# Patient Record
Sex: Male | Born: 1975 | Race: White | Hispanic: No | Marital: Single | Smoking: Current every day smoker
Health system: Southern US, Community
[De-identification: ages and names within clinical notes are randomized; demographics above are authoritative.]

## PROBLEM LIST (undated history)

## (undated) DIAGNOSIS — E119 Type 2 diabetes mellitus without complications: Secondary | ICD-10-CM

## (undated) DIAGNOSIS — I10 Essential (primary) hypertension: Secondary | ICD-10-CM

## (undated) HISTORY — PX: ROTATOR CUFF REPAIR: SHX139

---

## 2010-07-21 ENCOUNTER — Emergency Department (HOSPITAL_COMMUNITY): Payer: BC Managed Care – PPO

## 2010-07-21 ENCOUNTER — Emergency Department (HOSPITAL_COMMUNITY)
Admission: EM | Admit: 2010-07-21 | Discharge: 2010-07-22 | Disposition: A | Discharge status: Home / Self Care | Payer: BC Managed Care – PPO | Attending: Emergency Medicine | Admitting: Emergency Medicine

## 2010-07-21 DIAGNOSIS — R059 Cough, unspecified: Secondary | ICD-10-CM | POA: Insufficient documentation

## 2010-07-21 DIAGNOSIS — R05 Cough: Secondary | ICD-10-CM | POA: Insufficient documentation

## 2010-07-21 DIAGNOSIS — R509 Fever, unspecified: Secondary | ICD-10-CM | POA: Insufficient documentation

## 2010-07-21 DIAGNOSIS — R0602 Shortness of breath: Secondary | ICD-10-CM | POA: Insufficient documentation

## 2010-07-21 LAB — RAPID STREP SCREEN (MED CTR MEBANE ONLY): Streptococcus, Group A Screen (Direct): NEGATIVE

## 2012-01-28 ENCOUNTER — Emergency Department (INDEPENDENT_AMBULATORY_CARE_PROVIDER_SITE_OTHER)
Admission: EM | Admit: 2012-01-28 | Discharge: 2012-01-28 | Disposition: A | Discharge status: Home / Self Care | Payer: BC Managed Care – PPO | Source: Home / Self Care

## 2012-01-28 ENCOUNTER — Encounter (HOSPITAL_COMMUNITY): Payer: Self-pay | Admitting: *Deleted

## 2012-01-28 ENCOUNTER — Emergency Department (INDEPENDENT_AMBULATORY_CARE_PROVIDER_SITE_OTHER): Payer: BC Managed Care – PPO

## 2012-01-28 DIAGNOSIS — S40021A Contusion of right upper arm, initial encounter: Secondary | ICD-10-CM

## 2012-01-28 DIAGNOSIS — S40029A Contusion of unspecified upper arm, initial encounter: Secondary | ICD-10-CM

## 2012-01-28 NOTE — ED Notes (Signed)
Pt. requests a work note.  States he was sent home from work [employer]. and today due to pain.  Works as a Production designer, theatre/television/film at [employer].  Discussed with Dr. Artis Flock and he said to do the note to return to work tomorrow.  Note done as directed and given to pt.  Pt. not happy. States that is why he came here.  Pt. Asked what would constitute getting a note to be out of work.  I said a fracture.  I suggested he call Dr. August Saucer in the AM and see if he can be seen.
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
## 2018-06-16 ENCOUNTER — Telehealth: Payer: Self-pay | Admitting: Physician Assistant

## 2018-06-16 DIAGNOSIS — Z20822 Contact with and (suspected) exposure to covid-19: Secondary | ICD-10-CM

## 2018-06-16 DIAGNOSIS — R6889 Other general symptoms and signs: Principal | ICD-10-CM

## 2018-06-16 NOTE — Progress Notes (Signed)
E-Visit for Corona Virus Screening  Based on your current symptoms, you may very well have the virus, however your symptoms are mild. Currently, not all patients are being tested. If the symptoms are mild and there is not a known exposure, performing the test is not indicated.  You have been enrolled in MyChart Home Monitoring for COVID-19. Daily you will receive a questionnaire within the MyChart website. Our COVID-19 response team will be monitoring your responses daily.  Coronavirus disease 2019 (COVID-19) is a respiratory illness that can spread from person to person. The virus that causes COVID-19 is a new virus that was first identified in the country of [place] but is now found in multiple other countries and has spread to the [place].  Symptoms associated with the virus are mild to severe fever, cough, and shortness of breath. There is currently no vaccine to protect against COVID-19, and there is no specific antiviral treatment for the virus.   To be considered HIGH RISK for Coronavirus (COVID-19), you have to meet the following criteria:  . Traveled to [place], [place], [place], [place] or [place]; or in the [place] to [place], [place], [place], or [place]; and have fever, cough, and shortness of breath within the last 2 weeks of travel OR  . Been in close contact with a person diagnosed with COVID-19 within the last 2 weeks and have fever, cough, and shortness of breath  . IF YOU DO NOT MEET THESE CRITERIA, YOU ARE CONSIDERED LOW RISK FOR COVID-19.   It is vitally important that if you feel that you have an infection such as this virus or any other virus that you stay home and away from places where you may spread it to others.  You should self-quarantine for 14 days if you have symptoms that could potentially be coronavirus and avoid contact with people age 17 and older.   You can use medication such as Delsym for cough  You may also take acetaminophen (Tylenol) as needed  for fever.   Reduce your risk of any infection by using the same precautions used for avoiding the common cold or flu:  Marland Kitchen Wash your hands often with soap and warm water for at least 20 seconds.  If soap and water are not readily available, use an alcohol-based hand sanitizer with at least 60% alcohol.  . If coughing or sneezing, cover your mouth and nose by coughing or sneezing into the elbow areas of your shirt or coat, into a tissue or into your sleeve (not your hands). . Avoid shaking hands with others and consider head nods or verbal greetings only. . Avoid touching your eyes, nose, or mouth with unwashed hands.  . Avoid close contact with people who are sick. . Avoid places or events with large numbers of people in one location, like concerts or sporting events. . Carefully consider travel plans you have or are making. . If you are planning any travel outside or inside the [place], visit the CDC's Travelers' Health webpage for the latest health notices. . If you have some symptoms but not all symptoms, continue to monitor at home and seek medical attention if your symptoms worsen. . If you are having a medical emergency, call 911.  HOME CARE . Only take medications as instructed by your medical team. . Drink plenty of fluids and get plenty of rest. . A steam or ultrasonic humidifier can help if you have congestion.   GET HELP RIGHT AWAY IF: .  You develop worsening fever. . You become short of breath . You cough up blood. . Your symptoms become more severe MAKE SURE YOU   Understand these instructions.  Will watch your condition.  Will get help right away if you are not doing well or get worse.  Your e-visit answers were reviewed by a board certified advanced clinical practitioner to complete your personal care plan.  Depending on the condition, your plan could have included both over the counter or prescription medications.  If there is a problem please reply once you have received a  response from your provider. Your safety is important to us.  If you have drug allergies check your prescription carefully.    You can use MyChart to ask questions about today's visit, request a non-urgent call back, or ask for a work or school excuse for 24 hours related to this e-Visit. If it has been greater than 24 hours you will need to follow up with your provider, or enter a new e-Visit to address those concerns. You will get an e-mail in the next two days asking about your experience.  I hope that your e-visit has been valuable and will speed your recovery. Thank you for using e-visits.    

## 2018-06-16 NOTE — Progress Notes (Signed)
I have spent 5 minutes in review of e-visit questionnaire, review and updating patient chart, medical decision making and response to patient.   Anaiza Behrens Cody Jamani Bearce, PA-C    

## 2018-06-20 ENCOUNTER — Encounter: Payer: Self-pay | Admitting: Family Medicine

## 2018-06-20 ENCOUNTER — Telehealth (INDEPENDENT_AMBULATORY_CARE_PROVIDER_SITE_OTHER): Payer: Managed Care, Other (non HMO) | Admitting: Family Medicine

## 2018-06-20 DIAGNOSIS — R6889 Other general symptoms and signs: Secondary | ICD-10-CM

## 2018-06-20 DIAGNOSIS — Z20822 Contact with and (suspected) exposure to covid-19: Secondary | ICD-10-CM | POA: Insufficient documentation

## 2018-06-20 NOTE — Progress Notes (Signed)
Frederick Powell - 44 y.o. male MRN [id number]  Date of birth: 07/26/1975   This visit type was conducted due to national recommendations for restrictions regarding the COVID-19 Pandemic (e.g. social distancing).  This format is felt to be most appropriate for this patient at this time.  All issues noted in this document were discussed and addressed.  No physical exam was performed (except for noted visual exam findings with Video Visits).  I discussed the limitations of evaluation and management by telemedicine and the availability of in person appointments. The patient expressed understanding and agreed to proceed.  I connected with@ on 06/20/18 at  2:00 PM EDT by a video enabled telemedicine application and verified that I am speaking with the correct person using two identifiers.   Patient Location: Home [address] [place], [place] [address] [address]   Provider location:   [facility]  Chief Complaint  Patient presents with  . URI    COVID Sy/Sx, tested- not resulted yet. Sore throat/fever/cough/congestion-neon green mucus/head/sinus pressure/muscle aches/fatigued    HPI  Frederick Powell is a 43 y.o. male who presents via audio/video conferencing for a telehealth visit today.  This is my initial visit with him and he has symptoms concerning for COVID-19.  He completed E-visit on 5/7 after initial onset of symptoms and had COVID testing at drive-thru site at [facility] today.  He reports continued symptoms of fever, body aches, sore throat, congestion, fatigue and cough.  He denies shortness of breath, wheezing, tightness in chest, nausea, vomiting, diarrhea.  He is taking tylenol which reduces fever to around 99.5.  He denies any other medical problems including HTN or diabetes.  He is a daily smoker.      ROS:  A comprehensive ROS was completed and negative except as noted per HPI  No past medical history on file.  No past surgical history on file.   Family History  Problem Relation Age of Onset  . Colitis Mother     Social History   Socioeconomic History  . Marital status: Single    Spouse name: Not on file  . Number of children: Not on file  . Years of education: Not on file  . Highest education level: Not on file  Occupational History  . Not on file  Social Needs  . Financial resource strain: Not on file  . Food insecurity:    Worry: Not on file    Inability: Not on file  . Transportation needs:    Medical: Not on file    Non-medical: Not on file  Tobacco Use  . Smoking status: Current Some Day Smoker    Types: Cigarettes  . Smokeless tobacco: Never Used  Substance and Sexual Activity  . Alcohol use: Yes    Alcohol/week: 2.0 standard drinks    Types: 2 Cans of beer per week  . Drug use: No  . Sexual activity: Yes    Birth control/protection: None  Lifestyle  . Physical activity:    Days per week: Not on file    Minutes per session: Not on file  . Stress: Not on file  Relationships  . Social connections:    Talks on phone: Not on file    Gets together: Not on file    Attends religious service: Not on file    Active member of club or organization: Not on file    Attends meetings of clubs or organizations: Not on file    Relationship status: Not on  file  . Intimate partner violence:    Fear of current or ex partner: Not on file    Emotionally abused: Not on file    Physically abused: Not on file    Forced sexual activity: Not on file  Other Topics Concern  . Not on file  Social History Narrative  . Not on file     Current Outpatient Medications:  .  acetaminophen (TYLENOL) 325 MG tablet, Take 650 mg by mouth every 4 (four) hours as needed., Disp: , Rfl:   EXAM:  VITALS per patient if applicable: Temp 99.5 F (37.5 C) (Oral)   Ht 6' (1.829 m)   Wt 215 lb (97.5 kg) Comment: pt reports  BMI 29.16 kg/m   GENERAL: alert, oriented, appears well and in no acute distress  HEENT: atraumatic,  conjunttiva clear, no obvious abnormalities on inspection of external nose and ears  NECK: normal movements of the head and neck  LUNGS: on inspection no signs of respiratory distress, breathing rate appears normal, no obvious gross SOB, gasping or wheezing  CV: no obvious cyanosis  MS: moves all visible extremities without noticeable abnormality  PSYCH/NEURO: pleasant and cooperative, no obvious depression or anxiety, speech and thought processing grossly intact  ASSESSMENT AND PLAN:  Discussed the following assessment and plan:  Suspected Covid-19 Virus Infection -Symptoms consistent with viral etiology, possibly COVID-19 -He will continue to self isolate at home  -Provided with instruction that he may return to work once period of at least 7 days since initial symptoms have passed AND he is fever free with improving respiratory symptoms for 72 hours.  -Recommend warm salt water gargles/chloraseptic spray for sore throat -Push fluids and continue tylenol as needed.  -Reviewed if and when he should seek further help such as emergency care.        I discussed the assessment and treatment plan with the patient. The patient was provided an opportunity to ask questions and all were answered. The patient agreed with the plan and demonstrated an understanding of the instructions.   The patient was advised to call back or seek an in-person evaluation if the symptoms worsen or if the condition fails to improve as anticipated.    Everrett Coombeody Gurkaran Rahm, DO

## 2018-06-20 NOTE — Patient Instructions (Signed)
Increase fluid intake You may use chloraseptic spray and warm salt water gargles for sore throat   It was great to see you!  You have a viral upper respiratory infection. Antibiotics are not needed for this.  Viral infections usually take 7-10 days to resolve.  The cough can last a few weeks to go away.  Given the current COVID-19 outbreak, it is my professional recommendation that for your symptoms you should self-isolate for at least 7 days from when your symptoms started. Please do not return to work until you are fever free for at least 3 days without medications and significant improvement of your symptoms.  If you develop severe shortness of breath, uncontrolled fevers, coughing up blood, confusion, chest pain, or signs of dehydration (such as significantly decreased urine amounts or dizziness with standing) please CALL the ER and then GO to the ER.  Push fluids and get plenty of rest.  Call clinic with questions.  I hope you start feeling better soon!

## 2018-06-20 NOTE — Assessment & Plan Note (Signed)
-  Symptoms consistent with viral etiology, possibly COVID-19 -He will continue to self isolate at home  -Provided with instruction that he may return to work once period of at least 7 days since initial symptoms have passed AND he is fever free with improving respiratory symptoms for 72 hours.  -Recommend warm salt water gargles/chloraseptic spray for sore throat -Push fluids and continue tylenol as needed.  -Reviewed if and when he should seek further help such as emergency care.

## 2019-02-15 ENCOUNTER — Encounter (HOSPITAL_COMMUNITY): Payer: Self-pay

## 2019-02-15 ENCOUNTER — Emergency Department (HOSPITAL_COMMUNITY)
Admission: EM | Admit: 2019-02-15 | Discharge: 2019-02-15 | Disposition: A | Discharge status: Home / Self Care | Payer: Managed Care, Other (non HMO) | Attending: Emergency Medicine | Admitting: Emergency Medicine

## 2019-02-15 ENCOUNTER — Emergency Department (HOSPITAL_COMMUNITY): Payer: Managed Care, Other (non HMO)

## 2019-02-15 ENCOUNTER — Other Ambulatory Visit: Payer: Self-pay

## 2019-02-15 DIAGNOSIS — S0990XA Unspecified injury of head, initial encounter: Secondary | ICD-10-CM | POA: Diagnosis present

## 2019-02-15 DIAGNOSIS — S0083XA Contusion of other part of head, initial encounter: Secondary | ICD-10-CM | POA: Insufficient documentation

## 2019-02-15 DIAGNOSIS — Y939 Activity, unspecified: Secondary | ICD-10-CM | POA: Diagnosis not present

## 2019-02-15 DIAGNOSIS — Y999 Unspecified external cause status: Secondary | ICD-10-CM | POA: Insufficient documentation

## 2019-02-15 DIAGNOSIS — Y92410 Unspecified street and highway as the place of occurrence of the external cause: Secondary | ICD-10-CM | POA: Diagnosis not present

## 2019-02-15 DIAGNOSIS — F17210 Nicotine dependence, cigarettes, uncomplicated: Secondary | ICD-10-CM | POA: Diagnosis not present

## 2019-02-15 LAB — URINALYSIS, COMPLETE (UACMP) WITH MICROSCOPIC
Bacteria, UA: NONE SEEN
Bilirubin Urine: NEGATIVE
Glucose, UA: >=500 mg/dL — AB
Hgb urine dipstick: NEGATIVE
Ketones, ur: 5 mg/dL — AB
Leukocytes,Ua: NEGATIVE
Nitrite: NEGATIVE
Protein, ur: NEGATIVE mg/dL
Specific Gravity, Urine: 1.027 (ref 1.005–1.030)
pH: 5 (ref 5.0–8.0)

## 2019-02-15 LAB — CBC WITH DIFFERENTIAL/PLATELET
Abs Immature Granulocytes: 0.07 10*3/uL (ref 0.00–0.07)
Basophils Absolute: 0.1 10*3/uL (ref 0.0–0.1)
Basophils Relative: 1 %
Eosinophils Absolute: 0.8 10*3/uL — ABNORMAL HIGH (ref 0.0–0.5)
Eosinophils Relative: 8 %
HCT: 42.3 % (ref 39.0–52.0)
Hemoglobin: 14.3 g/dL (ref 13.0–17.0)
Immature Granulocytes: 1 %
Lymphocytes Relative: 18 %
Lymphs Abs: 1.8 10*3/uL (ref 0.7–4.0)
MCH: 29.4 pg (ref 26.0–34.0)
MCHC: 33.8 g/dL (ref 30.0–36.0)
MCV: 86.9 fL (ref 80.0–100.0)
Monocytes Absolute: 0.6 10*3/uL (ref 0.1–1.0)
Monocytes Relative: 6 %
Neutro Abs: 6.8 10*3/uL (ref 1.7–7.7)
Neutrophils Relative %: 66 %
Platelets: 253 10*3/uL (ref 150–400)
RBC: 4.87 MIL/uL (ref 4.22–5.81)
RDW: 12.2 % (ref 11.5–15.5)
WBC: 10.2 10*3/uL (ref 4.0–10.5)
nRBC: 0 % (ref 0.0–0.2)

## 2019-02-15 LAB — COMPREHENSIVE METABOLIC PANEL
ALT: 18 U/L (ref 0–44)
AST: 17 U/L (ref 15–41)
Albumin: 3.6 g/dL (ref 3.5–5.0)
Alkaline Phosphatase: 50 U/L (ref 38–126)
Anion gap: 10 (ref 5–15)
BUN: 14 mg/dL (ref 6–20)
CO2: 22 mmol/L (ref 22–32)
Calcium: 8.7 mg/dL — ABNORMAL LOW (ref 8.9–10.3)
Chloride: 100 mmol/L (ref 98–111)
Creatinine, Ser: 0.99 mg/dL (ref 0.61–1.24)
GFR calc Af Amer: >60 mL/min (ref >60)
GFR calc non Af Amer: >60 mL/min (ref >60)
Glucose, Bld: 401 mg/dL — ABNORMAL HIGH (ref 70–99)
Potassium: 4 mmol/L (ref 3.5–5.1)
Sodium: 132 mmol/L — ABNORMAL LOW (ref 135–145)
Total Bilirubin: 0.8 mg/dL (ref 0.3–1.2)
Total Protein: 6.6 g/dL (ref 6.5–8.1)

## 2019-02-15 LAB — RAPID URINE DRUG SCREEN, HOSP PERFORMED
Amphetamines: NOT DETECTED
Barbiturates: NOT DETECTED
Benzodiazepines: POSITIVE — AB
Cocaine: NOT DETECTED
Opiates: NOT DETECTED
Tetrahydrocannabinol: POSITIVE — AB

## 2019-02-15 LAB — CBG MONITORING, ED: Glucose-Capillary: 367 mg/dL — ABNORMAL HIGH (ref 70–99)

## 2019-02-15 LAB — ETHANOL: Alcohol, Ethyl (B): <10 mg/dL (ref <10)

## 2019-02-15 MED ORDER — METFORMIN HCL 500 MG PO TABS: 500.0000 mg | ORAL_TABLET | Freq: Two times a day (BID) | ORAL | 0 refills | Status: DC
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
## 2019-02-15 NOTE — Discharge Instructions (Addendum)
Your blood sugar was noted to be higher than normal.  To a point that we suspect you may have diabetes.  We will start you on a medication called Metformin.  This medication should be taken twice daily with food to avoid upset stomach.   Head Injury You have been seen today for a head injury.   Close observation: The close observation period is usually 6 hours from the injury. This includes staying awake and having a trustworthy adult monitor you to assure your condition does not worsen. You should be in regular contact with this person and ideally, they should be able to monitor you in person.  Secondary observation: The secondary observation period is usually 24 hours from the injury. You are allowed to sleep during this time. A trustworthy adult should intermittently monitor you to assure your condition does not worsen.   Overall head injury/concussion care: Rest: Be sure to get plenty of rest. You will need more rest and sleep while you recover. Hydration: Be sure to stay well hydrated by having a goal of drinking about 0.5 liters of water an hour. Pain:  Antiinflammatory medications: Take 600 mg of ibuprofen every 6 hours or 440 mg (over the counter dose) to 500 mg (prescription dose) of naproxen every 12 hours or for the next 3 days. After this time, these medications may be used as needed for pain. Take these medications with food to avoid upset stomach. Choose only one of these medications, do not take them together. Tylenol: Should you continue to have additional pain while taking the ibuprofen or naproxen, you may add in tylenol as needed. Your daily total maximum amount of tylenol from all sources should be limited to 4000mg /day for persons without liver problems, or 2000mg /day for those with liver problems. Return to sports and activities: In general, you may return to normal activities once symptoms have subsided, however, you would ideally be cleared by a primary care provider or other  qualified medical professional prior to return to these activities.  Follow up: Follow up with the concussion clinic or your primary care provider for further management of this issue. Return: Return to the ED should you begin to have confusion, abnormal behavior, aggression, violence, or personality changes, repeated vomiting, vision loss, numbness or weakness on one side of the body, difficulty standing due to dizziness, significantly worsening pain, or any other major concerns.

## 2019-02-21 ENCOUNTER — Encounter: Payer: Self-pay | Admitting: Family Medicine

## 2019-02-21 ENCOUNTER — Other Ambulatory Visit: Payer: Self-pay

## 2019-02-21 ENCOUNTER — Ambulatory Visit (INDEPENDENT_AMBULATORY_CARE_PROVIDER_SITE_OTHER): Payer: Managed Care, Other (non HMO) | Admitting: Family Medicine

## 2019-02-21 VITALS — BP 140/98 | HR 75 | Temp 97.6°F | Ht 72.0 in | Wt 194.4 lb

## 2019-02-21 DIAGNOSIS — F419 Anxiety disorder, unspecified: Secondary | ICD-10-CM

## 2019-02-21 DIAGNOSIS — R739 Hyperglycemia, unspecified: Secondary | ICD-10-CM

## 2019-02-21 DIAGNOSIS — R7309 Other abnormal glucose: Secondary | ICD-10-CM | POA: Diagnosis not present

## 2019-02-21 DIAGNOSIS — Z13220 Encounter for screening for lipoid disorders: Secondary | ICD-10-CM | POA: Diagnosis not present

## 2019-02-21 LAB — BASIC METABOLIC PANEL
BUN: 12 mg/dL (ref 6–23)
CO2: 27 mEq/L (ref 19–32)
Calcium: 9.3 mg/dL (ref 8.4–10.5)
Chloride: 99 mEq/L (ref 96–112)
Creatinine, Ser: 0.9 mg/dL (ref 0.40–1.50)
GFR: 91.39 mL/min (ref >60.00)
Glucose, Bld: 327 mg/dL — ABNORMAL HIGH (ref 70–99)
Potassium: 4.1 mEq/L (ref 3.5–5.1)
Sodium: 135 mEq/L (ref 135–145)

## 2019-02-21 LAB — LIPID PANEL
Cholesterol: 161 mg/dL (ref 0–200)
HDL: 47.8 mg/dL (ref >39.00)
LDL Cholesterol: 89 mg/dL (ref 0–99)
NonHDL: 113.6
Total CHOL/HDL Ratio: 3
Triglycerides: 121 mg/dL (ref 0.0–149.0)
VLDL: 24.2 mg/dL (ref 0.0–40.0)

## 2019-02-21 LAB — MICROALBUMIN / CREATININE URINE RATIO
Creatinine,U: 100.5 mg/dL
Microalb Creat Ratio: 2.2 mg/g (ref 0.0–30.0)
Microalb, Ur: 2.2 mg/dL — ABNORMAL HIGH (ref 0.0–1.9)

## 2019-02-21 LAB — HEMOGLOBIN A1C: Hgb A1c MFr Bld: 13.3 % — ABNORMAL HIGH (ref 4.6–6.5)

## 2019-02-21 MED ORDER — ESCITALOPRAM OXALATE 10 MG PO TABS: 10.0000 mg | ORAL_TABLET | Freq: Every day | ORAL | 1 refills | Status: DC
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
## 2019-02-21 NOTE — Assessment & Plan Note (Signed)
Admits to increase anxiety Discussed legal and medical consequences of taking unprescribed medications. He expresses understanding.  Will start lexapro to help with anxiety management.  F/u in about 4-6 weeks.

## 2019-02-21 NOTE — Assessment & Plan Note (Addendum)
Blood sugars in ED consistent with diabetes. He is asymptomatic at this time.  Check A1c as well as lipid panel for risk stratification.   Will have him f/u once labs results return to discuss tx.

## 2019-02-21 NOTE — Patient Instructions (Signed)
We'll be in touch with lab results Follow up with me in 1 month for new medication.    Generalized Anxiety Disorder, Adult Generalized anxiety disorder (GAD) is a mental health disorder. People with this condition constantly worry about everyday events. Unlike normal anxiety, worry related to GAD is not triggered by a specific event. These worries also do not fade or get better with time. GAD interferes with life functions, including relationships, work, and school. GAD can vary from mild to severe. People with severe GAD can have intense waves of anxiety with physical symptoms (panic attacks). What are the causes? The exact cause of GAD is not known. What increases the risk? This condition is more likely to develop in:  Women.  People who have a family history of anxiety disorders.  People who are very shy.  People who experience very stressful life events, such as the death of a loved one.  People who have a very stressful family environment. What are the signs or symptoms? People with GAD often worry excessively about many things in their lives, such as their health and family. They may also be overly concerned about:  Doing well at work.  Being on time.  Natural disasters.  Friendships. Physical symptoms of GAD include:  Fatigue.  Muscle tension or having muscle twitches.  Trembling or feeling shaky.  Being easily startled.  Feeling like your heart is pounding or racing.  Feeling out of breath or like you cannot take a deep breath.  Having trouble falling asleep or staying asleep.  Sweating.  Nausea, diarrhea, or irritable bowel syndrome (IBS).  Headaches.  Trouble concentrating or remembering facts.  Restlessness.  Irritability. How is this diagnosed? Your health care provider can diagnose GAD based on your symptoms and medical history. You will also have a physical exam. The health care provider will ask specific questions about your symptoms,  including how severe they are, when they started, and if they come and go. Your health care provider may ask you about your use of alcohol or drugs, including prescription medicines. Your health care provider may refer you to a mental health specialist for further evaluation. Your health care provider will do a thorough examination and may perform additional tests to rule out other possible causes of your symptoms. To be diagnosed with GAD, a person must have anxiety that:  Is out of his or her control.  Affects several different aspects of his or her life, such as work and relationships.  Causes distress that makes him or her unable to take part in normal activities.  Includes at least three physical symptoms of GAD, such as restlessness, fatigue, trouble concentrating, irritability, muscle tension, or sleep problems. Before your health care provider can confirm a diagnosis of GAD, these symptoms must be present more days than they are not, and they must last for six months or longer. How is this treated? The following therapies are usually used to treat GAD:  Medicine. Antidepressant medicine is usually prescribed for long-term daily control. Antianxiety medicines may be added in severe cases, especially when panic attacks occur.  Talk therapy (psychotherapy). Certain types of talk therapy can be helpful in treating GAD by providing support, education, and guidance. Options include: ? Cognitive behavioral therapy (CBT). People learn coping skills and techniques to ease their anxiety. They learn to identify unrealistic or negative thoughts and behaviors and to replace them with positive ones. ? Acceptance and commitment therapy (ACT). This treatment teaches people how to be mindful as  a way to cope with unwanted thoughts and feelings. ? Biofeedback. This process trains you to manage your body's response (physiological response) through breathing techniques and relaxation methods. You will work  with a therapist while machines are used to monitor your physical symptoms.  Stress management techniques. These include yoga, meditation, and exercise. A mental health specialist can help determine which treatment is best for you. Some people see improvement with one type of therapy. However, other people require a combination of therapies. Follow these instructions at home:  Take over-the-counter and prescription medicines only as told by your health care provider.  Try to maintain a normal routine.  Try to anticipate stressful situations and allow extra time to manage them.  Practice any stress management or self-calming techniques as taught by your health care provider.  Do not punish yourself for setbacks or for not making progress.  Try to recognize your accomplishments, even if they are small.  Keep all follow-up visits as told by your health care provider. This is important. Contact a health care provider if:  Your symptoms do not get better.  Your symptoms get worse.  You have signs of depression, such as: ? A persistently sad, cranky, or irritable mood. ? Loss of enjoyment in activities that used to bring you joy. ? Change in weight or eating. ? Changes in sleeping habits. ? Avoiding friends or family members. ? Loss of energy for normal tasks. ? Feelings of guilt or worthlessness. Get help right away if:  You have serious thoughts about hurting yourself or others. If you ever feel like you may hurt yourself or others, or have thoughts about taking your own life, get help right away. You can go to your nearest emergency department or call:  Your local emergency services (911 in the [place]  A suicide crisis helpline, such as the [place] at 646 105 1478. This is open 24 hours a day. Summary  Generalized anxiety disorder (GAD) is a mental health disorder that involves worry that is not triggered by a specific event.  People with GAD  often worry excessively about many things in their lives, such as their health and family.  GAD may cause physical symptoms such as restlessness, trouble concentrating, sleep problems, frequent sweating, nausea, diarrhea, headaches, and trembling or muscle twitching.  A mental health specialist can help determine which treatment is best for you. Some people see improvement with one type of therapy. However, other people require a combination of therapies. This information is not intended to replace advice given to you by your health care provider. Make sure you discuss any questions you have with your health care provider. Document Revised: 01/08/2017 Document Reviewed: 12/17/2015 Elsevier Patient Education  2020 [employer].

## 2019-02-21 NOTE — Assessment & Plan Note (Signed)
Healing cuts and bruising to legs He is not currently driving, may need DMV paperwork completed

## 2019-02-23 NOTE — Progress Notes (Signed)
Labs confirm he has diabetes.  Please have him schedule visit with me to discuss treatment.  VV is ok.   Thanks!

## 2019-02-24 ENCOUNTER — Encounter: Payer: Self-pay | Admitting: Family Medicine

## 2019-02-24 ENCOUNTER — Telehealth (INDEPENDENT_AMBULATORY_CARE_PROVIDER_SITE_OTHER): Payer: Managed Care, Other (non HMO) | Admitting: Family Medicine

## 2019-02-24 VITALS — Ht 72.0 in | Wt 194.0 lb

## 2019-02-24 DIAGNOSIS — E1165 Type 2 diabetes mellitus with hyperglycemia: Secondary | ICD-10-CM

## 2019-02-24 MED ORDER — ONETOUCH VERIO W/DEVICE KIT: PACK | 0 refills | Status: AC

## 2019-02-24 MED ORDER — ONETOUCH VERIO VI STRP: ORAL_STRIP | 12 refills | Status: DC

## 2019-02-24 MED ORDER — ONETOUCH DELICA LANCETS 30G MISC: 1.0000 | Freq: Every day | 1 refills | Status: DC

## 2019-02-24 MED ORDER — NOVOFINE 30G X 8 MM MISC: 1.0000 | 0 refills | Status: DC | PRN

## 2019-02-24 MED ORDER — LANTUS SOLOSTAR 100 UNIT/ML ~~LOC~~ SOPN: 15.0000 [IU] | PEN_INJECTOR | Freq: Every day | SUBCUTANEOUS | 1 refills | Status: DC

## 2019-02-24 NOTE — Progress Notes (Signed)
Frederick Powell - 44 y.o. male MRN [id number]  Date of birth: Jun 25, 1975   This visit type was conducted due to national recommendations for restrictions regarding the COVID-19 Pandemic (e.g. social distancing).  This format is felt to be most appropriate for this patient at this time.  All issues noted in this document were discussed and addressed.  No physical exam was performed (except for noted visual exam findings with Video Visits).  I discussed the limitations of evaluation and management by telemedicine and the availability of in person appointments. The patient expressed understanding and agreed to proceed.  I connected with@ on 02/24/19 at  9:20 AM EST by a video enabled telemedicine application and verified that I am speaking with the correct person using two identifiers.  Present at visit: Luetta Nutting, DO [facility]   Patient Location: Home [place] [address]   Provider location:   [place]  Chief Complaint  Patient presents with  . Follow-up    To discuss blood work     HPI  Frederick Powell is a 44 y.o. male who presents via audio/video conferencing for a telehealth visit today.  He is following up on recent labs confirming diabetes.  Current a1c of 13.3%.  He reports that he has been a little more thirsty and fatigued.  He denies increased urination.  He admits he could make changes to his diet.  He is treatment naive for his diabetes and knowledge of diabetes is limited.      ROS:  A comprehensive ROS was completed and negative except as noted per HPI  No past medical history on file.  Past Surgical History:  Procedure Laterality Date  . ROTATOR CUFF REPAIR      Family History  Problem Relation Age of Onset  . Colitis Mother     Social History   Socioeconomic History  . Marital status: Single    Spouse name: Not on file  . Number of children: Not on file  . Years of education: Not on file  .  Highest education level: Not on file  Occupational History  . Not on file  Tobacco Use  . Smoking status: Current Some Day Smoker    Types: Cigarettes  . Smokeless tobacco: Never Used  Substance and Sexual Activity  . Alcohol use: Yes    Alcohol/week: 2.0 standard drinks    Types: 2 Cans of beer per week  . Drug use: No  . Sexual activity: Yes    Birth control/protection: None  Other Topics Concern  . Not on file  Social History Narrative  . Not on file   Social Determinants of Health   Financial Resource Strain:   . Difficulty of Paying Living Expenses: Not on file  Food Insecurity:   . Worried About Charity fundraiser in the Last Year: Not on file  . Ran Out of Food in the Last Year: Not on file  Transportation Needs:   . Lack of Transportation (Medical): Not on file  . Lack of Transportation (Non-Medical): Not on file  Physical Activity:   . Days of Exercise per Week: Not on file  . Minutes of Exercise per Session: Not on file  Stress:   . Feeling of Stress : Not on file  Social Connections:   . Frequency of Communication with Friends and Family: Not on file  . Frequency of Social Gatherings with Friends and Family: Not on file  . Attends Religious Services: Not  on file  . Active Member of Clubs or Organizations: Not on file  . Attends Archivist Meetings: Not on file  . Marital Status: Not on file  Intimate Partner Violence:   . Fear of Current or Ex-Partner: Not on file  . Emotionally Abused: Not on file  . Physically Abused: Not on file  . Sexually Abused: Not on file     Current Outpatient Medications:  .  acetaminophen (TYLENOL) 325 MG tablet, Take 650 mg by mouth every 4 (four) hours as needed., Disp: , Rfl:  .  escitalopram (LEXAPRO) 10 MG tablet, Take 1 tablet (10 mg total) by mouth daily. 1/2 tab for first week then increase to whole tab, Disp: 30 tablet, Rfl: 1 .  metFORMIN (GLUCOPHAGE) 500 MG tablet, Take 1 tablet (500 mg total) by mouth 2  (two) times daily., Disp: 60 tablet, Rfl: 0 .  Blood Glucose Monitoring Suppl (ONETOUCH VERIO) w/Device KIT, Use to check glucose daily.  Please dispense appropriate lancets and strips., Disp: 1 kit, Rfl: 0 .  glucose blood (ONETOUCH VERIO) test strip, Use as instructed to check blood glucose, Disp: 100 each, Rfl: 12 .  Insulin Glargine (LANTUS SOLOSTAR) 100 UNIT/ML Solostar Pen, Inject 15 Units into the skin daily., Disp: 15 mL, Rfl: 1 .  Insulin Pen Needle (NOVOFINE) 30G X 8 MM MISC, Inject 10 each into the skin as needed (to inject insulin)., Disp: 100 each, Rfl: 0 .  OneTouch Delica Lancets 97C MISC, 1 Device by Does not apply route daily. Use as instructed to check blood glucose daily, Disp: 100 each, Rfl: 1  EXAM:  VITALS per patient if applicable: Ht 6' (1.638 m)   Wt 194 lb (88 kg)   BMI 26.31 kg/m   GENERAL: alert, oriented, appears well and in no acute distress  HEENT: atraumatic, conjunttiva clear, no obvious abnormalities on inspection of external nose and ears  NECK: normal movements of the head and neck  LUNGS: on inspection no signs of respiratory distress, breathing rate appears normal, no obvious gross SOB, gasping or wheezing  CV: no obvious cyanosis  MS: moves all visible extremities without noticeable abnormality  PSYCH/NEURO: pleasant and cooperative, no obvious depression or anxiety, speech and thought processing grossly intact  ASSESSMENT AND PLAN:  Discussed the following assessment and plan:  Type 2 diabetes mellitus with hyperglycemia, without long-term current use of insulin (HCC) Uncontrolled at this time.  Based on a1c and symptoms of increased thirst and fatigue I have recommended that he start insulin.  Will initiate lantus 15 units daily.  Recommend f/u in 3-4 weeks and we can titrated based on readings.   He was educated on a1c and how this correlates to his average blood sugars.  Discussed dietary changes and recommendations for carb intake and  daily exercise.  Reviewed standards of care for diabetes including recommended labs, yearly eye and foot exam.  He should received pneumovax at next in person appt as well as annual flu vaccine.   Instructed to check glucose daily Referral entered for diabetes education for continued teaching.   35 minutes of total time spent with patient including time for pre visit preparation, review and interpretation of lab results with patient, treatment recommendation and counseling on disease processes as well as coordination of care as outlined above.    I discussed the assessment and treatment plan with the patient. The patient was provided an opportunity to ask questions and all were answered. The patient agreed with the plan  and demonstrated an understanding of the instructions.   The patient was advised to call back or seek an in-person evaluation if the symptoms worsen or if the condition fails to improve as anticipated.    Luetta Nutting, DO

## 2019-02-24 NOTE — Telephone Encounter (Signed)
Letter sent via my chart

## 2019-02-24 NOTE — Assessment & Plan Note (Signed)
Uncontrolled at this time.  Based on a1c and symptoms of increased thirst and fatigue I have recommended that he start insulin.  Will initiate lantus 15 units daily.  Recommend f/u in 3-4 weeks and we can titrated based on readings.   He was educated on a1c and how this correlates to his average blood sugars.  Discussed dietary changes and recommendations for carb intake and daily exercise.  Reviewed standards of care for diabetes including recommended labs, yearly eye and foot exam.  He should received pneumovax at next in person appt as well as annual flu vaccine.   Instructed to check glucose daily Referral entered for diabetes education for continued teaching.

## 2019-02-27 ENCOUNTER — Telehealth: Payer: Self-pay | Admitting: Family Medicine

## 2019-02-27 NOTE — Telephone Encounter (Signed)
Patient mother is calling and requesting to speak to someone about getting diabetic supplies for patient . CB [phone].

## 2019-02-27 NOTE — Telephone Encounter (Signed)
LDM for pt to call office back.  I do not see a DPR in pt's chart.

## 2019-02-27 NOTE — Telephone Encounter (Signed)
Patient mother called back and said to disregard message that was left. Supplies were at pharmacy. CB [phone].

## 2019-03-10 ENCOUNTER — Other Ambulatory Visit: Payer: Self-pay

## 2019-03-10 ENCOUNTER — Ambulatory Visit (INDEPENDENT_AMBULATORY_CARE_PROVIDER_SITE_OTHER): Payer: Managed Care, Other (non HMO) | Admitting: Family Medicine

## 2019-03-10 ENCOUNTER — Encounter: Payer: Self-pay | Admitting: Family Medicine

## 2019-03-10 DIAGNOSIS — F17210 Nicotine dependence, cigarettes, uncomplicated: Secondary | ICD-10-CM | POA: Diagnosis not present

## 2019-03-10 DIAGNOSIS — R030 Elevated blood-pressure reading, without diagnosis of hypertension: Secondary | ICD-10-CM

## 2019-03-10 DIAGNOSIS — E1165 Type 2 diabetes mellitus with hyperglycemia: Secondary | ICD-10-CM

## 2019-03-10 DIAGNOSIS — F419 Anxiety disorder, unspecified: Secondary | ICD-10-CM | POA: Diagnosis not present

## 2019-03-10 MED ORDER — ESCITALOPRAM OXALATE 20 MG PO TABS: 20.0000 mg | ORAL_TABLET | Freq: Every day | ORAL | 3 refills | Status: DC

## 2019-03-10 MED ORDER — BUSPIRONE HCL 7.5 MG PO TABS: 7.5000 mg | ORAL_TABLET | Freq: Three times a day (TID) | ORAL | 1 refills | Status: DC

## 2019-03-10 NOTE — Patient Instructions (Signed)
Start buspar for anxiety Increase lexapro (escitalopram) to 20mg  each day.  Be sure to take daily for this to work.  Continue current insulin dose.  See me again in about 8 weeks.  Call to make appt at new location:  [facility] [address] [place],  [place]  [place] [phone]

## 2019-03-10 NOTE — Progress Notes (Signed)
Frederick Powell - 44 y.o. male MRN [id number]  Date of birth: Aug 02, 1975  Subjective Chief Complaint  Patient presents with  . Follow-up    follow up on DM and anxiety--anxiety is getting worse--not able to sleep--cant stop thinking--got a new job. DM. report bloos sugar reading at home 1/25- 298 & 149, 1/26--296 & 129, 1/27--154 & 135, 1/28--126 & 129, 1/29--145.     HPI Frederick Powell is a 44 y.o. male with history of anxiety and recently diagnosed T2DM here today for follow up.   T2DM:  Recent dx of T2DM with hyperglycemia.  Previous a1c of 13.3%.  Started on metformin and lantus 15 units at last appt   He is doing well with this and working on dietary changes.  Blood sugars over the past couple of days are around 110-125.  He denies hypoglycemia.  He has been referred to diabetic educator and made appt with optometrist.   Anxiety:  Reports continue anxiety with severe episodes and difficulty sleeping.  Started on lexapro at previous visit.  Reports compliance with treatment, hasn't noticed much improved yet since starting.    Depression screen [facility] 2/9 03/10/2019 02/21/2019  Decreased Interest 1 1  Down, Depressed, Hopeless 1 0  PHQ - 2 Score 2 1  Altered sleeping 3 -  Tired, decreased energy 1 -  Change in appetite 1 -  Feeling bad or failure about yourself  1 -  Trouble concentrating 3 -  Moving slowly or fidgety/restless 3 -  Suicidal thoughts 0 -  PHQ-9 Score 14 -   GAD 7 : Generalized Anxiety Score 03/10/2019  Nervous, Anxious, on Edge 3  Control/stop worrying 3  Worry too much - different things 3  Trouble relaxing 3  Restless 3  Easily annoyed or irritable 3  Afraid - awful might happen 3  Total GAD 7 Score 21    ROS:  A comprehensive ROS was completed and negative except as noted per HPI   No Known Allergies  No past medical history on file.  Past Surgical History:  Procedure Laterality Date  . ROTATOR CUFF REPAIR      Social History   Socioeconomic  History  . Marital status: Single    Spouse name: Not on file  . Number of children: Not on file  . Years of education: Not on file  . Highest education level: Not on file  Occupational History  . Not on file  Tobacco Use  . Smoking status: Current Some Day Smoker    Types: Cigarettes  . Smokeless tobacco: Never Used  Substance and Sexual Activity  . Alcohol use: Yes    Alcohol/week: 2.0 standard drinks    Types: 2 Cans of beer per week  . Drug use: No  . Sexual activity: Yes    Birth control/protection: None  Other Topics Concern  . Not on file  Social History Narrative  . Not on file   Social Determinants of Health   Financial Resource Strain:   . Difficulty of Paying Living Expenses: Not on file  Food Insecurity:   . Worried About Charity fundraiser in the Last Year: Not on file  . Ran Out of Food in the Last Year: Not on file  Transportation Needs:   . Lack of Transportation (Medical): Not on file  . Lack of Transportation (Non-Medical): Not on file  Physical Activity:   . Days of Exercise per Week: Not on file  . Minutes of Exercise per Session: Not on  file  Stress:   . Feeling of Stress : Not on file  Social Connections:   . Frequency of Communication with Friends and Family: Not on file  . Frequency of Social Gatherings with Friends and Family: Not on file  . Attends Religious Services: Not on file  . Active Member of Clubs or Organizations: Not on file  . Attends Banker Meetings: Not on file  . Marital Status: Not on file    Family History  Problem Relation Age of Onset  . Colitis Mother     Health Maintenance  Topic Date Due  . PNEUMOCOCCAL POLYSACCHARIDE VACCINE AGE 54-64 HIGH RISK  06/18/1976  . FOOT EXAM  06/18/1984  . OPHTHALMOLOGY EXAM  06/18/1984  . HIV Screening  06/18/1989  . TETANUS/TDAP  06/18/1993  . HEMOGLOBIN A1C  08/21/2019  . URINE MICROALBUMIN  02/21/2020  . INFLUENZA VACCINE  Completed     ----------------------------------------------------------------------------------------------------------------------------------------------------------------------------------------------------------------- Physical Exam BP (!) 158/100   Pulse 75   Temp 98.1 F (36.7 C) (Tympanic)   Ht 6' (1.829 m)   Wt 202 lb 9.6 oz (91.9 kg)   SpO2 97%   BMI 27.48 kg/m   Physical Exam Constitutional:      Appearance: Normal appearance.  HENT:     Head: Normocephalic and atraumatic.  Eyes:     General: No scleral icterus. Cardiovascular:     Rate and Rhythm: Normal rate and regular rhythm.  Pulmonary:     Effort: Pulmonary effort is normal.     Breath sounds: Normal breath sounds.  Skin:    General: Skin is warm and dry.  Neurological:     Mental Status: He is alert.    Diabetic Foot Exam - Simple   Simple Foot Form Diabetic Foot exam was performed with the following findings: Yes 03/12/2019  9:19 PM  Visual Inspection No deformities, no ulcerations, no other skin breakdown bilaterally: Yes Sensation Testing Intact to touch and monofilament testing bilaterally: Yes Pulse Check Posterior Tibialis and Dorsalis pulse intact bilaterally: Yes Comments      ------------------------------------------------------------------------------------------------------------------------------------------------------------------------------------------------------------------- Assessment and Plan  Type 2 diabetes mellitus with hyperglycemia, without long-term current use of insulin (HCC) Blood sugars stabilizing since starting treatment for diabetes.  Continue to work on lifestyle change.  Referred to diabetes education.  Continue current medications Has appt with optometrist.  Will think about pneumovax F/u in 3 months.   Anxiety Anxiety is not well controlled.  Increase lexapro to 20mg  and add buspar 7.5mg  TID F/u in 6-8 weeks.   Nicotine dependence Counseled on quitting however  not interested in quitting right now while anxiety is high.   Elevated blood pressure reading Likely related to anxiety and smoking.  Advised to quit smoking.   Discussed low salt diet.  If BP elevated at next f/u will add lisinopril.      This visit occurred during the SARS-CoV-2 public health emergency.  Safety protocols were in place, including screening questions prior to the visit, additional usage of staff PPE, and extensive cleaning of exam room while observing appropriate contact time as indicated for disinfecting solutions.

## 2019-03-12 ENCOUNTER — Encounter: Payer: Self-pay | Admitting: Family Medicine

## 2019-03-12 DIAGNOSIS — F17200 Nicotine dependence, unspecified, uncomplicated: Secondary | ICD-10-CM | POA: Insufficient documentation

## 2019-03-12 DIAGNOSIS — R030 Elevated blood-pressure reading, without diagnosis of hypertension: Secondary | ICD-10-CM | POA: Insufficient documentation

## 2019-03-12 NOTE — Assessment & Plan Note (Signed)
Counseled on quitting however not interested in quitting right now while anxiety is high.

## 2019-03-12 NOTE — Assessment & Plan Note (Signed)
Blood sugars stabilizing since starting treatment for diabetes.  Continue to work on lifestyle change.  Referred to diabetes education.  Continue current medications Has appt with optometrist.  Will think about pneumovax F/u in 3 months.

## 2019-03-12 NOTE — Assessment & Plan Note (Signed)
Likely related to anxiety and smoking.  Advised to quit smoking.   Discussed low salt diet.  If BP elevated at next f/u will add lisinopril.

## 2019-03-12 NOTE — Assessment & Plan Note (Signed)
Anxiety is not well controlled.  Increase lexapro to 20mg  and add buspar 7.5mg  TID F/u in 6-8 weeks.

## 2019-03-14 ENCOUNTER — Other Ambulatory Visit: Payer: Self-pay | Admitting: Family Medicine

## 2019-03-14 ENCOUNTER — Encounter: Payer: Self-pay | Admitting: Family Medicine

## 2019-03-14 MED ORDER — METFORMIN HCL 500 MG PO TABS: 500.0000 mg | ORAL_TABLET | Freq: Two times a day (BID) | ORAL | 2 refills | Status: DC

## 2019-04-05 ENCOUNTER — Encounter: Payer: Self-pay | Admitting: Family Medicine

## 2019-04-06 NOTE — Telephone Encounter (Signed)
These have been printed and placed in your box.

## 2019-04-07 ENCOUNTER — Encounter: Payer: Managed Care, Other (non HMO) | Attending: Family Medicine | Admitting: Registered"

## 2019-04-07 ENCOUNTER — Other Ambulatory Visit: Payer: Self-pay

## 2019-04-07 ENCOUNTER — Encounter: Payer: Self-pay | Admitting: Registered"

## 2019-04-07 ENCOUNTER — Encounter: Payer: Self-pay | Admitting: Family Medicine

## 2019-04-07 DIAGNOSIS — E1165 Type 2 diabetes mellitus with hyperglycemia: Secondary | ICD-10-CM | POA: Insufficient documentation

## 2019-04-07 NOTE — Patient Instructions (Addendum)
Plan:  Aim for 4-5 Carb Choices per meal (45-60 grams)  Aim for 0-2 Carbs choices per snack if hungry  Include protein with your meals and snacks Consider reading food labels for Total Carbohydrate of foods Continue your activity level. You may get more benefit by adding one more day (4 days instead of 3 days) Continue checking blood sugar, fasting 80-130; and 2 hours after with a meal target of less than 180.  Continuer taking medication as directed by MD Consider some sleep tips to help falling asleep.

## 2019-04-07 NOTE — Progress Notes (Signed)
Diabetes Self-Management Education  Visit Type: First/Initial  Appt. Start Time: 1000 Appt. End Time: 1100  04/07/2019  Mr. Frederick Powell Reason, identified by name and date of birth, is a 44 y.o. male with a diagnosis of Diabetes: Type 2.   ASSESSMENT  There were no vitals taken for this visit. There is no height or weight on file to calculate BMI.  Pt states prior to Dx he did not not he had T2DM in his family. Pt reports his mother has been in the medical field and has helped him understand the importance of managing this condition.  Patient states his goal is to get off insulin and just take oral medication. Pt states changes since diagnosis include cutting out [facility] (was drinking up to 120 oz/day), exercising more consistently, and reading labels to stay within 45-60 grams carbs per meal as his MD advised.  In addition to education regarding lowing blood sugar, patient states he is open to discuss ideas at follow-up visit to get better nutrition through meal prep rather than frozen meals.   Sleep: 6-8 hrs; 10 am-4 pm sometimes has a hard time going to sleep, but once asleep is fine. Stress: 5/10. Manageable   Diabetes Self-Management Education - 04/07/19 1005      Visit Information   Visit Type  First/Initial      Initial Visit   Diabetes Type  Type 2    Are you currently following a meal plan?  Yes    What type of meal plan do you follow?  45-60 g cho per meal    Are you taking your medications as prescribed?  Yes   metformin, insulin 15 u   Date Diagnosed  Jan 2021      Health Coping   How would you rate your overall health?  Good      Psychosocial Assessment   Patient Belief/Attitude about Diabetes  Motivated to manage diabetes    How often do you need to have someone help you when you read instructions, pamphlets, or other written materials from your doctor or pharmacy?  1 - Never    What is the last grade level you completed in school?  college      Complications   Last HgB A1C per patient/outside source  13 %    How often do you check your blood sugar?  1-2 times/day    Fasting Blood glucose range (mg/dL)  09-735    Number of hypoglycemic episodes per month  0    Have you had a dilated eye exam in the past 12 months?  No    Have you had a dental exam in the past 12 months?  No    Are you checking your feet?  No      Dietary Intake   Breakfast  oatmeal, 8-12 oz juice    Snack (morning)  none    Lunch  (frozen dinners) broccoli, chicken pasta    Snack (afternoon)  pretzels    Dinner  frozen dinner    Snack (evening)  none    Beverage(s)  4, 12 oz water with drop-in, coffee occassionally      Exercise   Exercise Type  Light (walking / raking leaves)    How many days per week to you exercise?  3    How many minutes per day do you exercise?  90    Total minutes per week of exercise  270      Patient Education   Disease  state   Definition of diabetes, type 1 and 2, and the diagnosis of diabetes    Nutrition management   Role of diet in the treatment of diabetes and the relationship between the three main macronutrients and blood glucose level;Food label reading, portion sizes and measuring food.    Physical activity and exercise   Role of exercise on diabetes management, blood pressure control and cardiac health.    Medications  Reviewed patients medication for diabetes, action, purpose, timing of dose and side effects.    Monitoring  Identified appropriate SMBG and/or A1C goals.    Chronic complications  Retinopathy and reason for yearly dilated eye exams    Psychosocial adjustment  Role of stress on diabetes      Individualized Goals (developed by patient)   Nutrition  General guidelines for healthy choices and portions discussed    Physical Activity  Exercise 3-5 times per week    Monitoring   test my blood glucose as discussed      Outcomes   Expected Outcomes  Demonstrated interest in learning. Expect positive outcomes    Future DMSE  4-6  wks    Program Status  Completed       Individualized Plan for Diabetes Self-Management Training:   Learning Objective:  Patient will have a greater understanding of diabetes self-management. Patient education plan is to attend individual and/or group sessions per assessed needs and concerns.  Patient Instructions  Plan:  Aim for 4-5 Carb Choices per meal (45-60 grams)  Aim for 0-2 Carbs choices per snack if hungry  Include protein with your meals and snacks Consider reading food labels for Total Carbohydrate of foods Continue your activity level. You may get more benefit by adding one more day (4 days instead of 3 days) Continue checking blood sugar, fasting 80-130; and 2 hours after with a meal target of less than 180.  Continuer taking medication as directed by MD Consider some sleep tips to help falling asleep.  Expected Outcomes:  Demonstrated interest in learning. Expect positive outcomes  Education material provided: ADA - How to Thrive: A Guide for Your Journey with Diabetes and Snack sheet, sleep hygiene  If problems or questions, patient to contact team via:  Phone and MyChart  Future DSME appointment: 4-6 wks

## 2019-05-12 ENCOUNTER — Encounter: Payer: Managed Care, Other (non HMO) | Attending: Family Medicine | Admitting: Registered"

## 2019-05-12 DIAGNOSIS — E1165 Type 2 diabetes mellitus with hyperglycemia: Secondary | ICD-10-CM | POA: Insufficient documentation

## 2019-05-22 ENCOUNTER — Other Ambulatory Visit: Payer: Self-pay

## 2019-05-22 ENCOUNTER — Encounter: Payer: Self-pay | Admitting: Family Medicine

## 2019-05-22 ENCOUNTER — Telehealth: Payer: Self-pay | Admitting: Family Medicine

## 2019-05-22 ENCOUNTER — Ambulatory Visit (INDEPENDENT_AMBULATORY_CARE_PROVIDER_SITE_OTHER): Payer: Managed Care, Other (non HMO) | Admitting: Family Medicine

## 2019-05-22 VITALS — BP 137/94 | HR 62 | Temp 98.7°F | Ht 72.0 in | Wt 205.0 lb

## 2019-05-22 DIAGNOSIS — E1165 Type 2 diabetes mellitus with hyperglycemia: Secondary | ICD-10-CM

## 2019-05-22 DIAGNOSIS — F419 Anxiety disorder, unspecified: Secondary | ICD-10-CM

## 2019-05-22 DIAGNOSIS — F17210 Nicotine dependence, cigarettes, uncomplicated: Secondary | ICD-10-CM

## 2019-05-22 LAB — POCT GLYCOSYLATED HEMOGLOBIN (HGB A1C): Hemoglobin A1C: 6.4 % — AB (ref 4.0–5.6)

## 2019-05-22 MED ORDER — LANTUS SOLOSTAR 100 UNIT/ML ~~LOC~~ SOPN: 15.0000 [IU] | PEN_INJECTOR | Freq: Every day | SUBCUTANEOUS | 1 refills | Status: DC

## 2019-05-22 MED ORDER — BUSPIRONE HCL 7.5 MG PO TABS: 7.5000 mg | ORAL_TABLET | Freq: Three times a day (TID) | ORAL | 1 refills | Status: DC

## 2019-05-22 MED ORDER — METFORMIN HCL 500 MG PO TABS: 500.0000 mg | ORAL_TABLET | Freq: Two times a day (BID) | ORAL | 2 refills | Status: DC

## 2019-05-22 MED ORDER — ESCITALOPRAM OXALATE 20 MG PO TABS: 20.0000 mg | ORAL_TABLET | Freq: Every day | ORAL | 1 refills | Status: DC

## 2019-05-22 MED ORDER — NOVOFINE 30G X 8 MM MISC: 1.0000 | 1 refills | Status: DC | PRN

## 2019-05-22 NOTE — Telephone Encounter (Signed)
-----   Message from Everrett Coombe, DO sent at 05/22/2019  9:32 AM EDT ----- Correction: Should be 3 month, not 1 month follow up.  Sorry, and thanks!

## 2019-05-22 NOTE — Assessment & Plan Note (Signed)
Counseled on smoking cessation and discussed increased heart disease with diabetes.

## 2019-05-22 NOTE — Assessment & Plan Note (Addendum)
Improved with lexapro and buspar.  Continue at current strength and schedule.

## 2019-05-22 NOTE — Telephone Encounter (Signed)
I called pt and left a VM for him to call us back and change his May appt to a July appt time.

## 2019-05-22 NOTE — Patient Instructions (Addendum)
Great to see you today! Lab Results  Component Value Date   HGBA1C 6.4 (A) 05/22/2019  Diabetes control is looking much better.  I would recommend that you have a pneumovax vaccine.  Information is attached.  You can have this completed with [place] or at a local pharmacy.   I would also recommend a yearly eye exam and quitting smoking.   Continue to follow a low carb diet.   See me again in about 3 months.

## 2019-05-22 NOTE — Progress Notes (Signed)
Frederick Powell - 44 y.o. male MRN [id number]  Date of birth: Mar 06, 1975  Subjective Chief Complaint  Patient presents with  . Diabetes    HPI Frederick Powell is a 44 y.o. male with history of T2DM, anxiety and depression and nicotine dependence here today for follow up.   -T2DM:  T2DM diagnosed at last appt. Started on lantus 15 units and metformin 500mg  BID.  He reports symptoms of increased thirst and fatigue have resolved since starting treatment.  He has made changes to diet and exercise as well.  He did have visit with nutritionist as well which he found helpful.  He denies any symptoms at this time.  He has never had pneumovax and is due for eye exam.    -GAD/Depression:  Current treatment with lexapro and buspar. He reports that this is working well for him.  He states that anxiety is better and he is having less problems with sleep.  He denies SI or symptoms of panic.   -Nicotine dependence: He does continue to smoke.  Not ready to quit yet but contemplating this.    ROS:  A comprehensive ROS was completed and negative except as noted per HPI  No Known Allergies  No past medical history on file.  Past Surgical History:  Procedure Laterality Date  . ROTATOR CUFF REPAIR      Social History   Socioeconomic History  . Marital status: Single    Spouse name: Not on file  . Number of children: Not on file  . Years of education: Not on file  . Highest education level: Not on file  Occupational History  . Not on file  Tobacco Use  . Smoking status: Current Some Day Smoker    Types: Cigarettes  . Smokeless tobacco: Never Used  Substance and Sexual Activity  . Alcohol use: Yes    Alcohol/week: 2.0 standard drinks    Types: 2 Cans of beer per week  . Drug use: No  . Sexual activity: Yes    Birth control/protection: None  Other Topics Concern  . Not on file  Social History Narrative  . Not on file   Social Determinants of Health   Financial Resource Strain:   .  Difficulty of Paying Living Expenses:   Food Insecurity:   . Worried About Charity fundraiser in the Last Year:   . Arboriculturist in the Last Year:   Transportation Needs:   . Film/video editor (Medical):   Marland Kitchen Lack of Transportation (Non-Medical):   Physical Activity:   . Days of Exercise per Week:   . Minutes of Exercise per Session:   Stress:   . Feeling of Stress :   Social Connections:   . Frequency of Communication with Friends and Family:   . Frequency of Social Gatherings with Friends and Family:   . Attends Religious Services:   . Active Member of Clubs or Organizations:   . Attends Archivist Meetings:   Marland Kitchen Marital Status:     Family History  Problem Relation Age of Onset  . Colitis Mother     Health Maintenance  Topic Date Due  . PNEUMOCOCCAL POLYSACCHARIDE VACCINE AGE 63-64 HIGH RISK  Never done  . OPHTHALMOLOGY EXAM  05/22/2019 (Originally 06/18/1984)  . TETANUS/TDAP  05/21/2020 (Originally 06/18/1993)  . HIV Screening  05/21/2020 (Originally 06/18/1989)  . INFLUENZA VACCINE  09/10/2019  . HEMOGLOBIN A1C  11/21/2019  . URINE MICROALBUMIN  02/21/2020  . FOOT EXAM  03/09/2020     ----------------------------------------------------------------------------------------------------------------------------------------------------------------------------------------------------------------- Physical Exam BP (!) 137/94   Pulse 62   Temp 98.7 F (37.1 C) (Oral)   Ht 6' (1.829 m)   Wt 205 lb (93 kg)   BMI 27.80 kg/m   Physical Exam Constitutional:      Appearance: Normal appearance.  HENT:     Head: Normocephalic and atraumatic.  Eyes:     General: No scleral icterus. Cardiovascular:     Rate and Rhythm: Normal rate and regular rhythm.  Pulmonary:     Effort: Pulmonary effort is normal.     Breath sounds: Normal breath sounds.  Abdominal:     General: Abdomen is flat.  Skin:    General: Skin is warm and dry.  Neurological:     General:  No focal deficit present.     Mental Status: He is alert.  Psychiatric:        Mood and Affect: Mood normal.        Behavior: Behavior normal.     ------------------------------------------------------------------------------------------------------------------------------------------------------------------------------------------------------------------- Assessment and Plan  Type 2 diabetes mellitus with hyperglycemia, without long-term current use of insulin (HCC) Most recent A1c of  Lab Results  Component Value Date   HGBA1C 6.4 (A) 05/22/2019   indicates diabetes control has improved significantly.  He will continue lantus 15 units and metformin 500mg  BID.  Counseled on healthy, low carb diet and recommend frequent activity to help with maintaining good control of blood sugars.  Discussed pneumovax and given information.  He will let me know if he would like to have this.    Nicotine dependence Counseled on smoking cessation and discussed increased heart disease with diabetes.   Anxiety Improved with lexapro and buspar.  Continue at current strength and schedule.    Meds ordered this encounter  Medications  . busPIRone (BUSPAR) 7.5 MG tablet    Sig: Take 1 tablet (7.5 mg total) by mouth 3 (three) times daily.    Dispense:  270 tablet    Refill:  1  . escitalopram (LEXAPRO) 20 MG tablet    Sig: Take 1 tablet (20 mg total) by mouth daily.    Dispense:  90 tablet    Refill:  1  . insulin glargine (LANTUS SOLOSTAR) 100 UNIT/ML Solostar Pen    Sig: Inject 15 Units into the skin daily.    Dispense:  15 mL    Refill:  1  . Insulin Pen Needle (NOVOFINE) 30G X 8 MM MISC    Sig: Inject 10 each into the skin as needed (to inject insulin).    Dispense:  100 each    Refill:  1  . metFORMIN (GLUCOPHAGE) 500 MG tablet    Sig: Take 1 tablet (500 mg total) by mouth 2 (two) times daily.    Dispense:  180 tablet    Refill:  2    Return in about 3 months (around 08/21/2019) for  DM.    This visit occurred during the SARS-CoV-2 public health emergency.  Safety protocols were in place, including screening questions prior to the visit, additional usage of staff PPE, and extensive cleaning of exam room while observing appropriate contact time as indicated for disinfecting solutions.

## 2019-05-22 NOTE — Assessment & Plan Note (Signed)
Most recent A1c of  Lab Results  Component Value Date   HGBA1C 6.4 (A) 05/22/2019   indicates diabetes control has improved significantly.  He will continue lantus 15 units and metformin 500mg  BID.  Counseled on healthy, low carb diet and recommend frequent activity to help with maintaining good control of blood sugars.  Discussed pneumovax and given information.  He will let me know if he would like to have this.

## 2019-06-07 LAB — HM DIABETES EYE EXAM

## 2019-06-08 ENCOUNTER — Encounter: Payer: Self-pay | Admitting: Family Medicine

## 2019-06-12 ENCOUNTER — Other Ambulatory Visit: Payer: Self-pay | Admitting: Family Medicine

## 2019-06-13 ENCOUNTER — Telehealth: Payer: Self-pay

## 2019-06-13 NOTE — Telephone Encounter (Signed)
Mr. Talarico left a message stating his employer is requesting the following information be included in his return to work letter:  1) your medical opinion stating why he can return to work with the new medication   2) what were his A1C and blood sugar? How have they improved or will improve

## 2019-06-14 ENCOUNTER — Encounter: Payer: Self-pay | Admitting: Family Medicine

## 2019-06-14 NOTE — Telephone Encounter (Signed)
Updated letter sent through mychart.

## 2019-06-15 NOTE — Telephone Encounter (Signed)
LVM for patient advising updated letter available for printing from MyChart.

## 2019-06-21 ENCOUNTER — Ambulatory Visit: Payer: Managed Care, Other (non HMO) | Admitting: Family Medicine

## 2019-07-06 ENCOUNTER — Telehealth: Payer: Self-pay

## 2019-07-06 NOTE — Telephone Encounter (Signed)
Patient left voicemail stating allergic to poison ivy.  Front office to contact for 1st available virtual or in-person visit.

## 2019-07-07 ENCOUNTER — Encounter: Payer: Self-pay | Admitting: Family Medicine

## 2019-07-07 ENCOUNTER — Telehealth (INDEPENDENT_AMBULATORY_CARE_PROVIDER_SITE_OTHER): Payer: Managed Care, Other (non HMO) | Admitting: Family Medicine

## 2019-07-07 DIAGNOSIS — L237 Allergic contact dermatitis due to plants, except food: Secondary | ICD-10-CM | POA: Diagnosis not present

## 2019-07-07 MED ORDER — PREDNISONE 10 MG PO TABS: 10.0000 mg | ORAL_TABLET | Freq: Every day | ORAL | 0 refills | Status: DC

## 2019-07-07 NOTE — Assessment & Plan Note (Signed)
Start prolonged prednisone taper.  He may continue calamine as needed.  Discussed that he should watch blood sugars while taking prednisone.  Call if having continued worsening or signs of infection.

## 2019-07-07 NOTE — Progress Notes (Signed)
Frederick Powell - 44 y.o. male MRN [id number]  Date of birth: [birth year]   This visit type was conducted due to national recommendations for restrictions regarding the COVID-19 Pandemic (e.g. social distancing).  This format is felt to be most appropriate for this patient at this time.  All issues noted in this document were discussed and addressed.  No physical exam was performed (except for noted visual exam findings with Video Visits).  I discussed the limitations of evaluation and management by telemedicine and the availability of in person appointments. The patient expressed understanding and agreed to proceed.  I connected with@ on 07/07/19 at  9:10 AM EDT by a video enabled telemedicine application and verified that I am speaking with the correct person using two identifiers.  Present at visit: Luetta Nutting, DO Cato Mulligan   Patient Location: [place] [address]   Provider location:   [facility]  Chief Complaint  Patient presents with  . Rash    HPI  Frederick Powell is a 44 y.o. male who presents via audio/video conferencing for a telehealth visit today.  He has complaint of rash located on forearms and legs.  He was doing some yard work last weekend and rash started a few days later.  Has had reaction to poison ivy before.  Rash is itch and has had some small fluid filled blisters.  He has tried calamine with limited relief.  He denies fever, chills, increased pain or redness.    ROS:  A comprehensive ROS was completed and negative except as noted per HPI  No past medical history on file.  Past Surgical History:  Procedure Laterality Date  . ROTATOR CUFF REPAIR      Family History  Problem Relation Age of Onset  . Colitis Mother     Social History   Socioeconomic History  . Marital status: Single    Spouse name: Not on file  . Number of children: Not on file  . Years of education: Not on file  . Highest education level: Not on  file  Occupational History  . Not on file  Tobacco Use  . Smoking status: Current Some Day Smoker    Types: Cigarettes  . Smokeless tobacco: Never Used  Substance and Sexual Activity  . Alcohol use: Yes    Alcohol/week: 2.0 standard drinks    Types: 2 Cans of beer per week  . Drug use: No  . Sexual activity: Yes    Birth control/protection: None  Other Topics Concern  . Not on file  Social History Narrative  . Not on file   Social Determinants of Health   Financial Resource Strain:   . Difficulty of Paying Living Expenses:   Food Insecurity:   . Worried About Charity fundraiser in the Last Year:   . Arboriculturist in the Last Year:   Transportation Needs:   . Film/video editor (Medical):   Marland Kitchen Lack of Transportation (Non-Medical):   Physical Activity:   . Days of Exercise per Week:   . Minutes of Exercise per Session:   Stress:   . Feeling of Stress :   Social Connections:   . Frequency of Communication with Friends and Family:   . Frequency of Social Gatherings with Friends and Family:   . Attends Religious Services:   . Active Member of Clubs or Organizations:   . Attends Archivist Meetings:   Marland Kitchen Marital Status:   Intimate Production manager  Violence:   . Fear of Current or Ex-Partner:   . Emotionally Abused:   Marland Kitchen Physically Abused:   . Sexually Abused:      Current Outpatient Medications:  .  acetaminophen (TYLENOL) 325 MG tablet, Take 650 mg by mouth every 4 (four) hours as needed., Disp: , Rfl:  .  Blood Glucose Monitoring Suppl (ONETOUCH VERIO) w/Device KIT, Use to check glucose daily.  Please dispense appropriate lancets and strips., Disp: 1 kit, Rfl: 0 .  busPIRone (BUSPAR) 7.5 MG tablet, Take 1 tablet (7.5 mg total) by mouth 3 (three) times daily., Disp: 270 tablet, Rfl: 1 .  escitalopram (LEXAPRO) 20 MG tablet, Take 1 tablet (20 mg total) by mouth daily., Disp: 90 tablet, Rfl: 1 .  glucose blood (ONETOUCH VERIO) test strip, Use as instructed to check  blood glucose, Disp: 100 each, Rfl: 12 .  insulin glargine (LANTUS SOLOSTAR) 100 UNIT/ML Solostar Pen, Inject 15 Units into the skin daily., Disp: 15 mL, Rfl: 1 .  Insulin Pen Needle (NOVOFINE) 30G X 8 MM MISC, Inject 10 each into the skin as needed (to inject insulin)., Disp: 100 each, Rfl: 1 .  metFORMIN (GLUCOPHAGE) 500 MG tablet, Take 1 tablet (500 mg total) by mouth 2 (two) times daily., Disp: 180 tablet, Rfl: 2 .  OneTouch Delica Lancets 47T MISC, 1 Device by Does not apply route daily. Use as instructed to check blood glucose daily, Disp: 100 each, Rfl: 1 .  predniSONE (DELTASONE) 10 MG tablet, Take 1 tablet (10 mg total) by mouth daily with breakfast. Take 68m x3 days, 376mx3 days, 208m 3 days, 75m21m days, 5mg 30mdays., Disp: 32 tablet, Rfl: 0  EXAM:  VITALS per patient if applicable: There were no vitals taken for this visit.  GENERAL: alert, oriented, appears well and in no acute distress  HEENT: atraumatic, conjunttiva clear, no obvious abnormalities on inspection of external nose and ears  NECK: normal movements of the head and neck  LUNGS: on inspection no signs of respiratory distress, breathing rate appears normal, no obvious gross SOB, gasping or wheezing  CV: no obvious cyanosis  MS: moves all visible extremities without noticeable abnormality  PSYCH/NEURO: pleasant and cooperative, no obvious depression or anxiety, speech and thought processing grossly intact  ASSESSMENT AND PLAN:  Discussed the following assessment and plan:  Poison ivy dermatitis Start prolonged prednisone taper.  He may continue calamine as needed.  Discussed that he should watch blood sugars while taking prednisone.  Call if having continued worsening or signs of infection.   30 minutes spent including pre visit preparation, review of prior notes and labs, encounter with patient via video visit and same day documentation.    I discussed the assessment and treatment plan with the  patient. The patient was provided an opportunity to ask questions and all were answered. The patient agreed with the plan and demonstrated an understanding of the instructions.   The patient was advised to call back or seek an in-person evaluation if the symptoms worsen or if the condition fails to improve as anticipated.    Malaky Tetrault Luetta Nutting

## 2019-07-07 NOTE — Progress Notes (Signed)
Pt reports that he was doing yard work and was chopping down a tree and his arms, legs and inner thighs were exposed. He has been using calamine lotion. He feels that it's not helping as much and the rash is still spreading.

## 2019-07-18 ENCOUNTER — Other Ambulatory Visit: Payer: Self-pay | Admitting: Family Medicine

## 2019-07-19 NOTE — Telephone Encounter (Signed)
CM-Plz see refill req/thx dmf 

## 2019-07-28 ENCOUNTER — Other Ambulatory Visit: Payer: Self-pay

## 2019-07-28 MED ORDER — METFORMIN HCL 500 MG PO TABS: 500.0000 mg | ORAL_TABLET | Freq: Two times a day (BID) | ORAL | 1 refills | Status: DC

## 2019-08-02 ENCOUNTER — Encounter: Payer: Self-pay | Admitting: Family Medicine

## 2019-08-02 ENCOUNTER — Ambulatory Visit (INDEPENDENT_AMBULATORY_CARE_PROVIDER_SITE_OTHER): Payer: Self-pay | Admitting: Family Medicine

## 2019-08-02 VITALS — BP 101/68 | HR 76 | Temp 98.5°F | Ht 72.0 in | Wt 193.0 lb

## 2019-08-02 DIAGNOSIS — F419 Anxiety disorder, unspecified: Secondary | ICD-10-CM

## 2019-08-02 DIAGNOSIS — E1165 Type 2 diabetes mellitus with hyperglycemia: Secondary | ICD-10-CM

## 2019-08-02 LAB — POCT GLYCOSYLATED HEMOGLOBIN (HGB A1C): Hemoglobin A1C: 5.7 % — AB (ref 4.0–5.6)

## 2019-08-02 MED ORDER — ATORVASTATIN CALCIUM 10 MG PO TABS: 10.0000 mg | ORAL_TABLET | Freq: Every day | ORAL | 3 refills | Status: DC

## 2019-08-02 NOTE — Assessment & Plan Note (Signed)
Stable with current dosing of lexapro and buspar.  Continue at current dose.

## 2019-08-02 NOTE — Assessment & Plan Note (Signed)
Most recent A1c of  Lab Results  Component Value Date   HGBA1C 5.7 (A) 08/02/2019   indicates diabetes is well controlled.  He has had a few episodes of hypoglycemia will decrease lantus to 10 units.  Adding low dose atorvastatin as well.  Counseled on healthy, low carb diet and recommend frequent activity to help with maintaining good control of blood sugars.

## 2019-08-02 NOTE — Patient Instructions (Signed)
Great job getting your blood sugars under control! You can decrease the insulin (lantus) to 10 units each day.  Continue metformin I am adding atorvastatin to help reduced your risk of heart attack and stroke.  Let me know if you have any side effects with this.   I will see you back in about 3 months.

## 2019-08-02 NOTE — Progress Notes (Signed)
Frederick Powell - 44 y.o. male MRN [id number]  Date of birth: 04-Jul-1975  Subjective Chief Complaint  Patient presents with  . Diabetes    HPI Frederick Powell is a 44 y.o. male with history of T2DM, depression with anxiety, and nicotine dependence here today for follow up.   -T2DM:  Newly diagnosed in 02/2019 with a1c of 13.3%.  He was started on lantus 15 units and metformin 500mg  daily.  Last a1c was down to 6.4% and is even lower at 5.7% today.  He has made significant changes to his diet which he feels has been helpful as well.  He has had a couple of episodes of hypoglycemia.    -Depression with anxiety:  Current management with lexapro and buspar.  He reports that this continues to work pretty well for him.  He denies significant side effects related to this.    ROS:  A comprehensive ROS was completed and negative except as noted per HPI  No Known Allergies  History reviewed. No pertinent past medical history.  Past Surgical History:  Procedure Laterality Date  . ROTATOR CUFF REPAIR      Social History   Socioeconomic History  . Marital status: Single    Spouse name: Not on file  . Number of children: Not on file  . Years of education: Not on file  . Highest education level: Not on file  Occupational History  . Not on file  Tobacco Use  . Smoking status: Current Some Day Smoker    Types: Cigarettes  . Smokeless tobacco: Never Used  Substance and Sexual Activity  . Alcohol use: Yes    Alcohol/week: 2.0 standard drinks    Types: 2 Cans of beer per week  . Drug use: No  . Sexual activity: Yes    Birth control/protection: None  Other Topics Concern  . Not on file  Social History Narrative  . Not on file   Social Determinants of Health   Financial Resource Strain:   . Difficulty of Paying Living Expenses:   Food Insecurity:   . Worried About in the Last Year:   . Programme researcher, broadcasting/film/video in the Last Year:   Transportation Needs:   . Barista (Medical):   Automotive engineer Lack of Transportation (Non-Medical):   Physical Activity:   . Days of Exercise per Week:   . Minutes of Exercise per Session:   Stress:   . Feeling of Stress :   Social Connections:   . Frequency of Communication with Friends and Family:   . Frequency of Social Gatherings with Friends and Family:   . Attends Religious Services:   . Active Member of Clubs or Organizations:   . Attends [employer] Meetings:   Banker Marital Status:     Family History  Problem Relation Age of Onset  . Colitis Mother     Health Maintenance  Topic Date Due  . Hepatitis C Screening  Never done  . OPHTHALMOLOGY EXAM  Never done  . TETANUS/TDAP  05/21/2020 (Originally 06/18/1993)  . HIV Screening  05/21/2020 (Originally 06/18/1989)  . PNEUMOCOCCAL POLYSACCHARIDE VACCINE AGE 32-64 HIGH RISK  07/09/2020 (Originally 06/18/1976)  . INFLUENZA VACCINE  09/10/2019  . HEMOGLOBIN A1C  02/01/2020  . URINE MICROALBUMIN  02/21/2020  . FOOT EXAM  03/09/2020  . COVID-19 Vaccine  Completed     ----------------------------------------------------------------------------------------------------------------------------------------------------------------------------------------------------------------- Physical Exam BP 101/68   Pulse 76   Temp 98.5 F (36.9 C) (Oral)  Ht 6' (1.829 m)   Wt 193 lb (87.5 kg)   SpO2 97% Comment: on RA  BMI 26.18 kg/m   Physical Exam Constitutional:      Appearance: Normal appearance.  HENT:     Head: Normocephalic and atraumatic.  Eyes:     General: No scleral icterus. Cardiovascular:     Rate and Rhythm: Normal rate and regular rhythm.  Pulmonary:     Effort: Pulmonary effort is normal.     Breath sounds: Normal breath sounds.  Musculoskeletal:     Cervical back: Neck supple.  Skin:    General: Skin is warm and dry.  Neurological:     General: No focal deficit present.     Mental Status: He is alert.  Psychiatric:        Mood and  Affect: Mood normal.        Behavior: Behavior normal.     ------------------------------------------------------------------------------------------------------------------------------------------------------------------------------------------------------------------- Assessment and Plan  Type 2 diabetes mellitus with hyperglycemia, without long-term current use of insulin (HCC) Most recent A1c of  Lab Results  Component Value Date   HGBA1C 5.7 (A) 08/02/2019   indicates diabetes is well controlled.  He has had a few episodes of hypoglycemia will decrease lantus to 10 units.  Adding low dose atorvastatin as well.  Counseled on healthy, low carb diet and recommend frequent activity to help with maintaining good control of blood sugars.    Anxiety Stable with current dosing of lexapro and buspar.  Continue at current dose.    Meds ordered this encounter  Medications  . atorvastatin (LIPITOR) 10 MG tablet    Sig: Take 1 tablet (10 mg total) by mouth daily.    Dispense:  90 tablet    Refill:  3    Return in about 3 months (around 11/02/2019) for T2DM/HLD.    This visit occurred during the SARS-CoV-2 public health emergency.  Safety protocols were in place, including screening questions prior to the visit, additional usage of staff PPE, and extensive cleaning of exam room while observing appropriate contact time as indicated for disinfecting solutions.

## 2019-08-17 ENCOUNTER — Other Ambulatory Visit: Payer: Self-pay | Admitting: Family Medicine

## 2019-08-21 ENCOUNTER — Ambulatory Visit: Payer: Managed Care, Other (non HMO) | Admitting: Family Medicine

## 2019-11-29 ENCOUNTER — Encounter: Payer: Self-pay | Admitting: Family Medicine

## 2019-11-29 ENCOUNTER — Ambulatory Visit (INDEPENDENT_AMBULATORY_CARE_PROVIDER_SITE_OTHER): Payer: Managed Care, Other (non HMO) | Admitting: Family Medicine

## 2019-11-29 ENCOUNTER — Other Ambulatory Visit: Payer: Self-pay

## 2019-11-29 VITALS — BP 149/80 | HR 67 | Wt 192.9 lb

## 2019-11-29 DIAGNOSIS — E1165 Type 2 diabetes mellitus with hyperglycemia: Secondary | ICD-10-CM

## 2019-11-29 DIAGNOSIS — I10 Essential (primary) hypertension: Secondary | ICD-10-CM | POA: Diagnosis not present

## 2019-11-29 DIAGNOSIS — F419 Anxiety disorder, unspecified: Secondary | ICD-10-CM

## 2019-11-29 DIAGNOSIS — Z23 Encounter for immunization: Secondary | ICD-10-CM

## 2019-11-29 LAB — POCT GLYCOSYLATED HEMOGLOBIN (HGB A1C): Hemoglobin A1C: 6.7 % — AB (ref 4.0–5.6)

## 2019-11-29 MED ORDER — FREESTYLE LIBRE 14 DAY READER DEVI: 0 refills | Status: DC

## 2019-11-29 MED ORDER — LANTUS SOLOSTAR 100 UNIT/ML ~~LOC~~ SOPN: 15.0000 [IU] | PEN_INJECTOR | Freq: Every day | SUBCUTANEOUS | 1 refills | Status: DC

## 2019-11-29 MED ORDER — METFORMIN HCL 500 MG PO TABS
500.0000 mg | ORAL_TABLET | Freq: Two times a day (BID) | ORAL | 1 refills | Status: DC
Start: 2019-11-29 — End: 2020-08-06

## 2019-11-29 MED ORDER — LOSARTAN POTASSIUM 25 MG PO TABS: 25.0000 mg | ORAL_TABLET | Freq: Every day | ORAL | 1 refills | Status: DC

## 2019-11-29 MED ORDER — FREESTYLE LIBRE 14 DAY SENSOR MISC: 0 refills | Status: DC

## 2019-11-29 MED ORDER — BUSPIRONE HCL 7.5 MG PO TABS
ORAL_TABLET | ORAL | 2 refills | Status: DC
Start: 2019-11-29 — End: 2020-08-06

## 2019-11-29 MED ORDER — ESCITALOPRAM OXALATE 20 MG PO TABS
20.0000 mg | ORAL_TABLET | Freq: Every day | ORAL | 1 refills | Status: DC
Start: 2019-11-29 — End: 2020-03-04

## 2019-11-29 NOTE — Patient Instructions (Addendum)
Lab Results  Component Value Date   HGBA1C 6.7 (A) 11/29/2019  Blood sugars are still looking pretty good.  Continue current medications.   Your blood pressure is elevated today.  I would like for you to start losartan 25mg  daily.   Follow up with me in about 3 months.  We'll get labs at this visit.

## 2019-11-30 ENCOUNTER — Other Ambulatory Visit: Payer: Self-pay | Admitting: Family Medicine

## 2019-12-03 DIAGNOSIS — I10 Essential (primary) hypertension: Secondary | ICD-10-CM | POA: Insufficient documentation

## 2019-12-03 NOTE — Assessment & Plan Note (Signed)
BP remains elevated. Started losartan 25mg  daily. Recommend low sodium diet as well.

## 2019-12-03 NOTE — Progress Notes (Signed)
Frederick Powell - 44 y.o. male MRN [id number]  Date of birth: December 07, 1975  Subjective Chief Complaint  Patient presents with  . Diabetes    HPI Frederick Powell is a 44 y.o. male here today for follow up visit. He has history of T2DM, HTN and anxiety.  He has no additional concerns today.   Diabetes is currently treated with metformin and lantus 15 units daily.  He reports he has been doing well.  No side effects from medication including symptoms of hypoglycemia.  He has made improvements to his diet.  Weight is stable.  He would like to try   Anxiety remains well controlled with lexapro and buspar.  He is tolerating medication well.    ROS:  A comprehensive ROS was completed and negative except as noted per HPI  No Known Allergies  History reviewed. No pertinent past medical history.  Past Surgical History:  Procedure Laterality Date  . ROTATOR CUFF REPAIR      Social History   Socioeconomic History  . Marital status: Single    Spouse name: Not on file  . Number of children: Not on file  . Years of education: Not on file  . Highest education level: Not on file  Occupational History  . Not on file  Tobacco Use  . Smoking status: Current Some Day Smoker    Types: Cigarettes  . Smokeless tobacco: Never Used  Substance and Sexual Activity  . Alcohol use: Yes    Alcohol/week: 2.0 standard drinks    Types: 2 Cans of beer per week  . Drug use: No  . Sexual activity: Yes    Birth control/protection: None  Other Topics Concern  . Not on file  Social History Narrative  . Not on file   Social Determinants of Health   Financial Resource Strain:   . Difficulty of Paying Living Expenses: Not on file  Food Insecurity:   . Worried About Programme researcher, broadcasting/film/video in the Last Year: Not on file  . Ran Out of Food in the Last Year: Not on file  Transportation Needs:   . Lack of Transportation (Medical): Not on file  . Lack of Transportation (Non-Medical): Not on file  Physical  Activity:   . Days of Exercise per Week: Not on file  . Minutes of Exercise per Session: Not on file  Stress:   . Feeling of Stress : Not on file  Social Connections:   . Frequency of Communication with Friends and Family: Not on file  . Frequency of Social Gatherings with Friends and Family: Not on file  . Attends Religious Services: Not on file  . Active Member of Clubs or Organizations: Not on file  . Attends Banker Meetings: Not on file  . Marital Status: Not on file    Family History  Problem Relation Age of Onset  . Colitis Mother     Health Maintenance  Topic Date Due  . Hepatitis C Screening  Never done  . TETANUS/TDAP  05/21/2020 (Originally 06/18/1993)  . HIV Screening  05/21/2020 (Originally 06/18/1989)  . PNEUMOCOCCAL POLYSACCHARIDE VACCINE AGE 60-64 HIGH RISK  07/09/2020 (Originally 06/18/1976)  . FOOT EXAM  03/09/2020  . HEMOGLOBIN A1C  05/29/2020  . OPHTHALMOLOGY EXAM  06/06/2020  . INFLUENZA VACCINE  Completed  . COVID-19 Vaccine  Completed     ----------------------------------------------------------------------------------------------------------------------------------------------------------------------------------------------------------------- Physical Exam BP (!) 149/80 (BP Location: Left Arm, Patient Position: Sitting, Cuff Size: Normal)   Pulse 67   Wt 192 lb  14.4 oz (87.5 kg)   SpO2 99%   BMI 26.16 kg/m   Physical Exam Constitutional:      Appearance: Normal appearance.  HENT:     Head: Normocephalic and atraumatic.  Eyes:     General: No scleral icterus. Cardiovascular:     Rate and Rhythm: Normal rate and regular rhythm.  Pulmonary:     Effort: Pulmonary effort is normal.     Breath sounds: Normal breath sounds.  Musculoskeletal:     Cervical back: Neck supple.  Neurological:     General: No focal deficit present.     Mental Status: He is alert.  Psychiatric:        Mood and Affect: Mood normal.        Behavior:  Behavior normal.     ------------------------------------------------------------------------------------------------------------------------------------------------------------------------------------------------------------------- Assessment and Plan  Type 2 diabetes mellitus with hyperglycemia, without long-term current use of insulin (HCC) Most recent A1c of  Lab Results  Component Value Date   HGBA1C 6.7 (A) 11/29/2019   indicates diabetes is well controlled.  He will continue current medcations.  Counseled on healthy, low carb diet and recommend frequent activity to help with maintaining good control of blood sugars.    Essential hypertension BP remains elevated. Started losartan 25mg  daily. Recommend low sodium diet as well.    Anxiety Lexapro and buspar is working well for him.  Will plan to continue.    Meds ordered this encounter  Medications  . metFORMIN (GLUCOPHAGE) 500 MG tablet    Sig: Take 1 tablet (500 mg total) by mouth 2 (two) times daily.    Dispense:  180 tablet    Refill:  1  . insulin glargine (LANTUS SOLOSTAR) 100 UNIT/ML Solostar Pen    Sig: Inject 15 Units into the skin daily.    Dispense:  15 mL    Refill:  1  . escitalopram (LEXAPRO) 20 MG tablet    Sig: Take 1 tablet (20 mg total) by mouth daily.    Dispense:  90 tablet    Refill:  1  . busPIRone (BUSPAR) 7.5 MG tablet    Sig: TAKE 1 TABLET(7.5 MG) BY MOUTH THREE TIMES DAILY    Dispense:  90 tablet    Refill:  2  . Continuous Blood Gluc Sensor (FREESTYLE LIBRE 14 DAY SENSOR) MISC    Sig: Use to check glucose as needed daily. Change every 14 days.    Dispense:  6 each    Refill:  0  . Continuous Blood Gluc Receiver (FREESTYLE LIBRE 14 DAY READER) DEVI    Sig: Use to check glucose as needed each day.    Dispense:  1 each    Refill:  0  . losartan (COZAAR) 25 MG tablet    Sig: Take 1 tablet (25 mg total) by mouth daily.    Dispense:  90 tablet    Refill:  1    Return in about 3 months  (around 02/29/2020) for T2DM/HTN.    This visit occurred during the SARS-CoV-2 public health emergency.  Safety protocols were in place, including screening questions prior to the visit, additional usage of staff PPE, and extensive cleaning of exam room while observing appropriate contact time as indicated for disinfecting solutions.

## 2019-12-03 NOTE — Assessment & Plan Note (Signed)
Lexapro and buspar is working well for him.  Will plan to continue.

## 2019-12-03 NOTE — Assessment & Plan Note (Signed)
Most recent A1c of  Lab Results  Component Value Date   HGBA1C 6.7 (A) 11/29/2019   indicates diabetes is well controlled.  He will continue current medcations.  Counseled on healthy, low carb diet and recommend frequent activity to help with maintaining good control of blood sugars.

## 2020-01-29 ENCOUNTER — Other Ambulatory Visit: Payer: Self-pay | Admitting: Family Medicine

## 2020-02-22 ENCOUNTER — Ambulatory Visit: Payer: Managed Care, Other (non HMO) | Admitting: Family Medicine

## 2020-03-02 ENCOUNTER — Other Ambulatory Visit: Payer: Self-pay | Admitting: Family Medicine

## 2020-03-05 ENCOUNTER — Other Ambulatory Visit: Payer: Self-pay

## 2020-03-05 ENCOUNTER — Encounter: Payer: Self-pay | Admitting: Family Medicine

## 2020-03-05 ENCOUNTER — Ambulatory Visit (INDEPENDENT_AMBULATORY_CARE_PROVIDER_SITE_OTHER): Payer: Managed Care, Other (non HMO) | Admitting: Family Medicine

## 2020-03-05 VITALS — BP 166/103 | HR 92 | Temp 97.9°F | Wt 194.8 lb

## 2020-03-05 DIAGNOSIS — E1165 Type 2 diabetes mellitus with hyperglycemia: Secondary | ICD-10-CM

## 2020-03-05 DIAGNOSIS — I10 Essential (primary) hypertension: Secondary | ICD-10-CM | POA: Diagnosis not present

## 2020-03-05 DIAGNOSIS — E785 Hyperlipidemia, unspecified: Secondary | ICD-10-CM

## 2020-03-05 DIAGNOSIS — E1169 Type 2 diabetes mellitus with other specified complication: Secondary | ICD-10-CM | POA: Diagnosis not present

## 2020-03-05 LAB — POCT GLYCOSYLATED HEMOGLOBIN (HGB A1C): Hemoglobin A1C: 7.4 % — AB (ref 4.0–5.6)

## 2020-03-05 MED ORDER — LANTUS SOLOSTAR 100 UNIT/ML ~~LOC~~ SOPN: 15.0000 [IU] | PEN_INJECTOR | Freq: Every day | SUBCUTANEOUS | 3 refills | Status: DC

## 2020-03-05 MED ORDER — LOSARTAN POTASSIUM 50 MG PO TABS: 50.0000 mg | ORAL_TABLET | Freq: Every day | ORAL | 2 refills | Status: DC

## 2020-03-05 NOTE — Patient Instructions (Signed)
Increase your lantus to 15 units daily.  Continue metformin  See me again in 3 months.

## 2020-03-06 DIAGNOSIS — E1169 Type 2 diabetes mellitus with other specified complication: Secondary | ICD-10-CM | POA: Insufficient documentation

## 2020-03-06 DIAGNOSIS — E785 Hyperlipidemia, unspecified: Secondary | ICD-10-CM | POA: Insufficient documentation

## 2020-03-06 NOTE — Assessment & Plan Note (Signed)
He is doing well with atorvastatin.  Continue current strength. He will return to have fasting labs.

## 2020-03-06 NOTE — Assessment & Plan Note (Signed)
Blood pressure is elevated today, increase losartan to 50 mg.  Counseled on smoking cessation and low-sodium diet.

## 2020-03-06 NOTE — Progress Notes (Signed)
Frederick Powell - 45 y.o. male MRN [id number]  Date of birth: June 03, 1975  Subjective Chief Complaint  Patient presents with  . Hypertension  . Diabetes    HPI Frederick Powell is a 45 year old male here today for follow-up of hypertension and diabetes.  His initial diagnosis of diabetes was a little over 1 year ago.  He had improved his A1c from 13.3 down to 5.7%.  It is slowly started to creep back up again over the past few months.  He reports that he has been a little more liberal with his diet and has not been exercising as regularly.  He continues on Metformin as well as Lantus at 10 units daily.  He does have associated hyperlipidemia and is tolerating atorvastatin well for management of this.  His blood pressure is elevated today.  He is not monitoring blood pressure at home.  He is taking losartan 25 mg daily.  He has not had chest pain, shortness of breath, palpitations, headache or vision changes.  ROS:  A comprehensive ROS was completed and negative except as noted per HPI  No Known Allergies  History reviewed. No pertinent past medical history.  Past Surgical History:  Procedure Laterality Date  . ROTATOR CUFF REPAIR      Social History   Socioeconomic History  . Marital status: Single    Spouse name: Not on file  . Number of children: Not on file  . Years of education: Not on file  . Highest education level: Not on file  Occupational History  . Not on file  Tobacco Use  . Smoking status: Current Some Day Smoker    Types: Cigarettes  . Smokeless tobacco: Never Used  Substance and Sexual Activity  . Alcohol use: Yes    Alcohol/week: 2.0 standard drinks    Types: 2 Cans of beer per week  . Drug use: No  . Sexual activity: Yes    Birth control/protection: None  Other Topics Concern  . Not on file  Social History Narrative  . Not on file   Social Determinants of Health   Financial Resource Strain: Not on file  Food Insecurity: Not on file  Transportation  Needs: Not on file  Physical Activity: Not on file  Stress: Not on file  Social Connections: Not on file    Family History  Problem Relation Age of Onset  . Colitis Mother     Health Maintenance  Topic Date Due  . Hepatitis C Screening  Never done  . COLONOSCOPY (Pts 45-37yrs Insurance coverage will need to be confirmed)  Never done  . TETANUS/TDAP  05/21/2020 (Originally 06/18/1993)  . HIV Screening  05/21/2020 (Originally 06/18/1989)  . PNEUMOCOCCAL POLYSACCHARIDE VACCINE AGE 24-64 HIGH RISK  07/09/2020 (Originally 06/18/1976)  . FOOT EXAM  03/09/2020  . OPHTHALMOLOGY EXAM  06/06/2020  . HEMOGLOBIN A1C  09/02/2020  . INFLUENZA VACCINE  Completed  . COVID-19 Vaccine  Completed     ----------------------------------------------------------------------------------------------------------------------------------------------------------------------------------------------------------------- Physical Exam BP (!) 166/103 (BP Location: Left Arm, Patient Position: Sitting, Cuff Size: Normal)   Pulse 92   Temp 97.9 F (36.6 C)   Wt 194 lb 12.8 oz (88.4 kg)   SpO2 99%   BMI 26.42 kg/m   Physical Exam Constitutional:      Appearance: Normal appearance.  Eyes:     General: No scleral icterus. Cardiovascular:     Rate and Rhythm: Normal rate and regular rhythm.  Pulmonary:     Effort: Pulmonary effort is normal.  Breath sounds: Normal breath sounds.  Musculoskeletal:     Cervical back: Neck supple.  Skin:    General: Skin is warm and dry.  Neurological:     General: No focal deficit present.     Mental Status: He is alert.  Psychiatric:        Mood and Affect: Mood normal.        Behavior: Behavior normal.     ------------------------------------------------------------------------------------------------------------------------------------------------------------------------------------------------------------------- Assessment and Plan  Essential  hypertension Blood pressure is elevated today, increase losartan to 50 mg.  Counseled on smoking cessation and low-sodium diet.  Type 2 diabetes mellitus with hyperglycemia, without long-term current use of insulin (HCC) A1c has been increasing over the past few months.  We will have him increase Lantus to 15 units and continue to monitor blood sugars closely.  Continue to work on dietary changes to help with blood sugar control.  Hyperlipidemia associated with type 2 diabetes mellitus (HCC) He is doing well with atorvastatin.  Continue current strength. He will return to have fasting labs.    Meds ordered this encounter  Medications  . insulin glargine (LANTUS SOLOSTAR) 100 UNIT/ML Solostar Pen    Sig: Inject 15 Units into the skin daily.    Dispense:  15 mL    Refill:  3  . losartan (COZAAR) 50 MG tablet    Sig: Take 1 tablet (50 mg total) by mouth daily.    Dispense:  90 tablet    Refill:  2    Return in about 3 months (around 06/03/2020) for T2DM/HTN.    This visit occurred during the SARS-CoV-2 public health emergency.  Safety protocols were in place, including screening questions prior to the visit, additional usage of staff PPE, and extensive cleaning of exam room while observing appropriate contact time as indicated for disinfecting solutions.

## 2020-03-06 NOTE — Assessment & Plan Note (Signed)
A1c has been increasing over the past few months.  We will have him increase Lantus to 15 units and continue to monitor blood sugars closely.  Continue to work on dietary changes to help with blood sugar control.

## 2020-03-25 ENCOUNTER — Other Ambulatory Visit: Payer: Self-pay | Admitting: Family Medicine

## 2020-06-03 ENCOUNTER — Ambulatory Visit: Payer: Managed Care, Other (non HMO) | Admitting: Family Medicine

## 2020-07-27 ENCOUNTER — Other Ambulatory Visit: Payer: Self-pay | Admitting: Family Medicine

## 2020-08-06 ENCOUNTER — Ambulatory Visit (INDEPENDENT_AMBULATORY_CARE_PROVIDER_SITE_OTHER): Payer: Managed Care, Other (non HMO) | Admitting: Family Medicine

## 2020-08-06 ENCOUNTER — Ambulatory Visit: Payer: Managed Care, Other (non HMO) | Admitting: Family Medicine

## 2020-08-06 ENCOUNTER — Encounter: Payer: Self-pay | Admitting: Family Medicine

## 2020-08-06 ENCOUNTER — Other Ambulatory Visit: Payer: Self-pay

## 2020-08-06 VITALS — BP 153/85 | HR 72 | Temp 98.0°F | Ht 72.0 in | Wt 198.9 lb

## 2020-08-06 DIAGNOSIS — E1169 Type 2 diabetes mellitus with other specified complication: Secondary | ICD-10-CM | POA: Diagnosis not present

## 2020-08-06 DIAGNOSIS — F419 Anxiety disorder, unspecified: Secondary | ICD-10-CM

## 2020-08-06 DIAGNOSIS — Z1211 Encounter for screening for malignant neoplasm of colon: Secondary | ICD-10-CM

## 2020-08-06 DIAGNOSIS — Z1159 Encounter for screening for other viral diseases: Secondary | ICD-10-CM | POA: Diagnosis not present

## 2020-08-06 DIAGNOSIS — E1165 Type 2 diabetes mellitus with hyperglycemia: Secondary | ICD-10-CM | POA: Diagnosis not present

## 2020-08-06 DIAGNOSIS — I10 Essential (primary) hypertension: Secondary | ICD-10-CM

## 2020-08-06 DIAGNOSIS — E785 Hyperlipidemia, unspecified: Secondary | ICD-10-CM

## 2020-08-06 MED ORDER — BUSPIRONE HCL 7.5 MG PO TABS: ORAL_TABLET | ORAL | 2 refills | Status: DC

## 2020-08-06 MED ORDER — ESCITALOPRAM OXALATE 20 MG PO TABS: ORAL_TABLET | ORAL | 1 refills | Status: DC

## 2020-08-06 MED ORDER — METFORMIN HCL 500 MG PO TABS: 500.0000 mg | ORAL_TABLET | Freq: Two times a day (BID) | ORAL | 1 refills | Status: DC

## 2020-08-06 MED ORDER — LANTUS SOLOSTAR 100 UNIT/ML ~~LOC~~ SOPN: 15.0000 [IU] | PEN_INJECTOR | Freq: Every day | SUBCUTANEOUS | 3 refills | Status: DC

## 2020-08-06 MED ORDER — LOSARTAN POTASSIUM 100 MG PO TABS: 100.0000 mg | ORAL_TABLET | Freq: Every day | ORAL | 2 refills | Status: DC

## 2020-08-06 NOTE — Progress Notes (Signed)
Fate Caster - 45 y.o. male MRN [id number]  Date of birth: [birth year]  Subjective Chief Complaint  Patient presents with   Diabetes    HPI Frederick Powell is a 45 y.o. male here today for follow up visit.  He reports that he is doing well.   He is checking blood sugars and tells me that readings at home have been around 110-120.  He denies hypoglycemia.  He has no other symptoms related to his diabetes at this time.  He is taking atorvastatin for associated HLD.  Denies myalgias.    He is taking losartan for tx of HTN.  He denies side effects related to medication.  He denies chest pain, shortness of breath, palpitations, headache or vision changes.  Anxiety remains well controlled with lexapro and buspar.   ROS:  A comprehensive ROS was completed and negative except as noted per HPI  No Known Allergies  History reviewed. No pertinent past medical history.  Past Surgical History:  Procedure Laterality Date   ROTATOR CUFF REPAIR      Social History   Socioeconomic History   Marital status: Single    Spouse name: Not on file   Number of children: Not on file   Years of education: Not on file   Highest education level: Not on file  Occupational History   Not on file  Tobacco Use   Smoking status: Some Days    Pack years: 0.00    Types: Cigarettes   Smokeless tobacco: Never  Substance and Sexual Activity   Alcohol use: Yes    Alcohol/week: 2.0 standard drinks    Types: 2 Cans of beer per week   Drug use: No   Sexual activity: Yes    Birth control/protection: None  Other Topics Concern   Not on file  Social History Narrative   Not on file   Social Determinants of Health   Financial Resource Strain: Not on file  Food Insecurity: Not on file  Transportation Needs: Not on file  Physical Activity: Not on file  Stress: Not on file  Social Connections: Not on file    Family History  Problem Relation Age of Onset   Colitis Mother     Health Maintenance   Topic Date Due   PNEUMOCOCCAL POLYSACCHARIDE VACCINE AGE 13-64 HIGH RISK  Never done   Pneumococcal Vaccine 69-32 Years old (1 - PCV) Never done   HIV Screening  Never done   Hepatitis C Screening  Never done   TETANUS/TDAP  Never done   COLONOSCOPY (Pts 45-40yrs Insurance coverage will need to be confirmed)  Never done   OPHTHALMOLOGY EXAM  06/06/2020   HEMOGLOBIN A1C  09/02/2020   INFLUENZA VACCINE  09/09/2020   FOOT EXAM  08/06/2021   COVID-19 Vaccine  Completed   HPV VACCINES  Aged Out     ----------------------------------------------------------------------------------------------------------------------------------------------------------------------------------------------------------------- Physical Exam BP (!) 153/85 (BP Location: Left Arm, Patient Position: Sitting, Cuff Size: Normal)   Pulse 72   Temp 98 F (36.7 C)   Ht 6' (1.829 m)   Wt 198 lb 14.4 oz (90.2 kg)   SpO2 96%   BMI 26.98 kg/m   Physical Exam Constitutional:      Appearance: Normal appearance.  HENT:     Head: Normocephalic and atraumatic.  Eyes:     General: No scleral icterus. Cardiovascular:     Rate and Rhythm: Normal rate and regular rhythm.  Pulmonary:     Effort: Pulmonary effort is normal.  Breath sounds: Normal breath sounds.  Musculoskeletal:     Cervical back: Neck supple.  Skin:    General: Skin is warm and dry.  Neurological:     General: No focal deficit present.     Mental Status: He is alert.  Psychiatric:        Mood and Affect: Mood normal.        Behavior: Behavior normal.    ------------------------------------------------------------------------------------------------------------------------------------------------------------------------------------------------------------------- Assessment and Plan  Essential hypertension BP elevated today.  Increase losartan to 100mg  daily.  Low sodium diet and smoking cessation advised.  Return in 1 month for nurse visit to  recheck BP  Type 2 diabetes mellitus with hyperglycemia, without long-term current use of insulin (HCC) Update a1c today.  Continue insulin and metformin at current strength.    Hyperlipidemia associated with type 2 diabetes mellitus (HCC) Tolerating atorvastatin well.  Update lipid panel.   Anxiety Stable with lexapro and buspar.   rx renewed.     Meds ordered this encounter  Medications   busPIRone (BUSPAR) 7.5 MG tablet    Sig: TAKE 1 TABLET(7.5 MG) BY MOUTH THREE TIMES DAILY    Dispense:  90 tablet    Refill:  2   escitalopram (LEXAPRO) 20 MG tablet    Sig: TAKE 1 TABLET(20 MG) BY MOUTH DAILY    Dispense:  90 tablet    Refill:  1   metFORMIN (GLUCOPHAGE) 500 MG tablet    Sig: Take 1 tablet (500 mg total) by mouth 2 (two) times daily.    Dispense:  180 tablet    Refill:  1   insulin glargine (LANTUS SOLOSTAR) 100 UNIT/ML Solostar Pen    Sig: Inject 15 Units into the skin daily.    Dispense:  15 mL    Refill:  3   losartan (COZAAR) 100 MG tablet    Sig: Take 1 tablet (100 mg total) by mouth daily.    Dispense:  90 tablet    Refill:  2    Return in about 4 months (around 12/06/2020) for HTN/DM.    This visit occurred during the SARS-CoV-2 public health emergency.  Safety protocols were in place, including screening questions prior to the visit, additional usage of staff PPE, and extensive cleaning of exam room while observing appropriate contact time as indicated for disinfecting solutions.

## 2020-08-06 NOTE — Patient Instructions (Signed)
We'll be in touch with lab results Continue home monitoring of blood sugars.  Increase losartan to 100mg  for elevated blood pressure.  Return in 1 month for BP check with nurse and 3 months with me  Managing Your Hypertension Hypertension, also called high blood pressure, is when the force of the blood pressing against the walls of the arteries is too strong. Arteries are blood vessels that carry blood from your heart throughout your body. Hypertension forces the heart to work harder to pump blood and may cause the arteries tobecome narrow or stiff. Understanding blood pressure readings Your personal target blood pressure may vary depending on your medical conditions, your age, and other factors. A blood pressure reading includes a higher number over a lower number. Ideally, your blood pressure should be below 120/80. You should know that: The first, or top, number is called the systolic pressure. It is a measure of the pressure in your arteries as your heart beats. The second, or bottom number, is called the diastolic pressure. It is a measure of the pressure in your arteries as the heart relaxes. Blood pressure is classified into four stages. Based on your blood pressure reading, your health care provider may use the following stages to determine what type of treatment you need, if any. Systolic pressure and diastolicpressure are measured in a unit called mmHg. Normal Systolic pressure: below 120. Diastolic pressure: below 80. Elevated Systolic pressure: 120-129. Diastolic pressure: below 80. Hypertension stage 1 Systolic pressure: 130-139. Diastolic pressure: 80-89. Hypertension stage 2 Systolic pressure: 140 or above. Diastolic pressure: 90 or above. How can this condition affect me? Managing your hypertension is an important responsibility. Over time, hypertension can damage the arteries and decrease blood flow to important parts of the body, including the brain, heart, and kidneys.  Having untreated or uncontrolled hypertension can lead to: A heart attack. A stroke. A weakened blood vessel (aneurysm). Heart failure. Kidney damage. Eye damage. Metabolic syndrome. Memory and concentration problems. Vascular dementia. What actions can I take to manage this condition? Hypertension can be managed by making lifestyle changes and possibly by taking medicines. Your health care provider will help you make a plan to bring yourblood pressure within a normal range. Nutrition  Eat a diet that is high in fiber and potassium, and low in salt (sodium), added sugar, and fat. An example eating plan is called the Dietary Approaches to Stop Hypertension (DASH) diet. To eat this way: Eat plenty of fresh fruits and vegetables. Try to fill one-half of your plate at each meal with fruits and vegetables. Eat whole grains, such as whole-wheat pasta, brown rice, or whole-grain bread. Fill about one-fourth of your plate with whole grains. Eat low-fat dairy products. Avoid fatty cuts of meat, processed or cured meats, and poultry with skin. Fill about one-fourth of your plate with lean proteins such as fish, chicken without skin, beans, eggs, and tofu. Avoid pre-made and processed foods. These tend to be higher in sodium, added sugar, and fat. Reduce your daily sodium intake. Most people with hypertension should eat less than 1,500 mg of sodium a day.  Lifestyle  Work with your health care provider to maintain a healthy body weight or to lose weight. Ask what an ideal weight is for you. Get at least 30 minutes of exercise that causes your heart to beat faster (aerobic exercise) most days of the week. Activities may include walking, swimming, or biking. Include exercise to strengthen your muscles (resistance exercise), such as weight lifting, as  part of your weekly exercise routine. Try to do these types of exercises for 30 minutes at least 3 days a week. Do not use any products that contain  nicotine or tobacco, such as cigarettes, e-cigarettes, and chewing tobacco. If you need help quitting, ask your health care provider. Control any long-term (chronic) conditions you have, such as high cholesterol or diabetes. Identify your sources of stress and find ways to manage stress. This may include meditation, deep breathing, or making time for fun activities.  Alcohol use Do not drink alcohol if: Your health care provider tells you not to drink. You are pregnant, may be pregnant, or are planning to become pregnant. If you drink alcohol: Limit how much you use to: 0-1 drink a day for women. 0-2 drinks a day for men. Be aware of how much alcohol is in your drink. In the [place] one drink equals one 12 oz bottle of beer (355 mL), one 5 oz glass of wine (148 mL), or one 1 oz glass of hard liquor (44 mL). Medicines Your health care provider may prescribe medicine if lifestyle changes are not enough to get your blood pressure under control and if: Your systolic blood pressure is 130 or higher. Your diastolic blood pressure is 80 or higher. Take medicines only as told by your health care provider. Follow the directions carefully. Blood pressure medicines must be taken as told by your health care provider. The medicine does not work as well when you skip doses. Skippingdoses also puts you at risk for problems. Monitoring Before you monitor your blood pressure: Do not smoke, drink caffeinated beverages, or exercise within 30 minutes before taking a measurement. Use the bathroom and empty your bladder (urinate). Sit quietly for at least 5 minutes before taking measurements. Monitor your blood pressure at home as told by your health care provider. To do this: Sit with your back straight and supported. Place your feet flat on the floor. Do not cross your legs. Support your arm on a flat surface, such as a table. Make sure your upper arm is at heart level. Each time you measure, take two or three  readings one minute apart and record the results. You may also need to have your blood pressure checked regularly by your healthcare provider. General information Talk with your health care provider about your diet, exercise habits, and other lifestyle factors that may be contributing to hypertension. Review all the medicines you take with your health care provider because there may be side effects or interactions. Keep all visits as told by your health care provider. Your health care provider can help you create and adjust your plan for managing your high blood pressure. Where to find more information National Heart, Lung, and Blood Institute: [employer]is American Heart Association: www.heart.org Contact a health care provider if: You think you are having a reaction to medicines you have taken. You have repeated (recurrent) headaches. You feel dizzy. You have swelling in your ankles. You have trouble with your vision. Get help right away if: You develop a severe headache or confusion. You have unusual weakness or numbness, or you feel faint. You have severe pain in your chest or abdomen. You vomit repeatedly. You have trouble breathing. These symptoms may represent a serious problem that is an emergency. Do not wait to see if the symptoms will go away. Get medical help right away. Call your local emergency services (911 in the [place] Do not drive yourself to the hospital. Summary Hypertension is when  the force of blood pumping through your arteries is too strong. If this condition is not controlled, it may put you at risk for serious complications. Your personal target blood pressure may vary depending on your medical conditions, your age, and other factors. For most people, a normal blood pressure is less than 120/80. Hypertension is managed by lifestyle changes, medicines, or both. Lifestyle changes to help manage hypertension include losing weight, eating a healthy, low-sodium  diet, exercising more, stopping smoking, and limiting alcohol. This information is not intended to replace advice given to you by your health care provider. Make sure you discuss any questions you have with your healthcare provider. Document Revised: 03/03/2019 Document Reviewed: 12/27/2018 Elsevier Patient Education  2022 [employer].

## 2020-08-06 NOTE — Assessment & Plan Note (Signed)
Update a1c today.  Continue insulin and metformin at current strength.

## 2020-08-06 NOTE — Assessment & Plan Note (Signed)
Stable with lexapro and buspar.   rx renewed.

## 2020-08-06 NOTE — Assessment & Plan Note (Signed)
Tolerating atorvastatin well.  Update lipid panel.  

## 2020-08-06 NOTE — Assessment & Plan Note (Signed)
BP elevated today.  Increase losartan to 100mg  daily.  Low sodium diet and smoking cessation advised.  Return in 1 month for nurse visit to recheck BP

## 2020-08-07 LAB — COMPREHENSIVE METABOLIC PANEL
AG Ratio: 1.3 (calc) (ref 1.0–2.5)
ALT: 13 U/L (ref 9–46)
AST: 11 U/L (ref 10–40)
Albumin: 3.9 g/dL (ref 3.6–5.1)
Alkaline phosphatase (APISO): 47 U/L (ref 36–130)
BUN: 18 mg/dL (ref 7–25)
CO2: 25 mmol/L (ref 20–32)
Calcium: 9 mg/dL (ref 8.6–10.3)
Chloride: 103 mmol/L (ref 98–110)
Creat: 0.86 mg/dL (ref 0.60–1.35)
Globulin: 2.9 g/dL (calc) (ref 1.9–3.7)
Glucose, Bld: 211 mg/dL — ABNORMAL HIGH (ref 65–99)
Potassium: 4 mmol/L (ref 3.5–5.3)
Sodium: 138 mmol/L (ref 135–146)
Total Bilirubin: 0.3 mg/dL (ref 0.2–1.2)
Total Protein: 6.8 g/dL (ref 6.1–8.1)

## 2020-08-07 LAB — CBC WITH DIFFERENTIAL/PLATELET
Absolute Monocytes: 476 cells/uL (ref 200–950)
Basophils Absolute: 49 cells/uL (ref 0–200)
Basophils Relative: 0.6 %
Eosinophils Absolute: 254 cells/uL (ref 15–500)
Eosinophils Relative: 3.1 %
HCT: 46.3 % (ref 38.5–50.0)
Hemoglobin: 15.4 g/dL (ref 13.2–17.1)
Lymphs Abs: 3173 cells/uL (ref 850–3900)
MCH: 29.1 pg (ref 27.0–33.0)
MCHC: 33.3 g/dL (ref 32.0–36.0)
MCV: 87.4 fL (ref 80.0–100.0)
MPV: 11.2 fL (ref 7.5–12.5)
Monocytes Relative: 5.8 %
Neutro Abs: 4248 cells/uL (ref 1500–7800)
Neutrophils Relative %: 51.8 %
Platelets: 335 10*3/uL (ref 140–400)
RBC: 5.3 10*6/uL (ref 4.20–5.80)
RDW: 12.5 % (ref 11.0–15.0)
Total Lymphocyte: 38.7 %
WBC: 8.2 10*3/uL (ref 3.8–10.8)

## 2020-08-07 LAB — LIPID PANEL W/REFLEX DIRECT LDL
Cholesterol: 132 mg/dL (ref <200)
HDL: 55 mg/dL (ref >=40)
LDL Cholesterol (Calc): 52 mg/dL
Non-HDL Cholesterol (Calc): 77 mg/dL (ref <130)
Total CHOL/HDL Ratio: 2.4 (calc) (ref <5.0)
Triglycerides: 167 mg/dL — ABNORMAL HIGH (ref <150)

## 2020-08-07 LAB — HEMOGLOBIN A1C
Hgb A1c MFr Bld: 10.3 % of total Hgb — ABNORMAL HIGH (ref <5.7)
Mean Plasma Glucose: 249 mg/dL
eAG (mmol/L): 13.8 mmol/L

## 2020-08-07 LAB — HEPATITIS C ANTIBODY
Hepatitis C Ab: NONREACTIVE
SIGNAL TO CUT-OFF: 0.01 (ref <1.00)

## 2020-12-03 ENCOUNTER — Ambulatory Visit: Payer: Managed Care, Other (non HMO) | Admitting: Family Medicine

## 2020-12-25 ENCOUNTER — Ambulatory Visit: Payer: Managed Care, Other (non HMO) | Admitting: Family Medicine

## 2021-01-07 ENCOUNTER — Ambulatory Visit (INDEPENDENT_AMBULATORY_CARE_PROVIDER_SITE_OTHER): Payer: Managed Care, Other (non HMO) | Admitting: Family Medicine

## 2021-01-07 ENCOUNTER — Encounter: Payer: Self-pay | Admitting: Family Medicine

## 2021-01-07 ENCOUNTER — Other Ambulatory Visit: Payer: Self-pay

## 2021-01-07 VITALS — BP 143/87 | HR 65 | Temp 97.8°F | Ht 72.0 in | Wt 197.3 lb

## 2021-01-07 DIAGNOSIS — F419 Anxiety disorder, unspecified: Secondary | ICD-10-CM | POA: Diagnosis not present

## 2021-01-07 DIAGNOSIS — E1169 Type 2 diabetes mellitus with other specified complication: Secondary | ICD-10-CM

## 2021-01-07 DIAGNOSIS — I10 Essential (primary) hypertension: Secondary | ICD-10-CM | POA: Diagnosis not present

## 2021-01-07 DIAGNOSIS — E1165 Type 2 diabetes mellitus with hyperglycemia: Secondary | ICD-10-CM | POA: Diagnosis not present

## 2021-01-07 DIAGNOSIS — E785 Hyperlipidemia, unspecified: Secondary | ICD-10-CM

## 2021-01-07 DIAGNOSIS — N529 Male erectile dysfunction, unspecified: Secondary | ICD-10-CM

## 2021-01-07 LAB — POCT GLYCOSYLATED HEMOGLOBIN (HGB A1C): HbA1c, POC (controlled diabetic range): 7.3 % — AB (ref 0.0–7.0)

## 2021-01-07 MED ORDER — ONETOUCH VERIO VI STRP: ORAL_STRIP | 2 refills | Status: AC

## 2021-01-07 MED ORDER — ESCITALOPRAM OXALATE 20 MG PO TABS: ORAL_TABLET | ORAL | 1 refills | Status: DC

## 2021-01-07 MED ORDER — ATORVASTATIN CALCIUM 10 MG PO TABS: ORAL_TABLET | ORAL | 3 refills | Status: DC

## 2021-01-07 MED ORDER — TADALAFIL 20 MG PO TABS: 10.0000 mg | ORAL_TABLET | ORAL | 11 refills | Status: AC | PRN

## 2021-01-07 NOTE — Assessment & Plan Note (Signed)
He continues to do well with Lexapro at current strength.

## 2021-01-07 NOTE — Assessment & Plan Note (Signed)
Discussed trial of tadalafil.  Side effects and precautions related to medication reviewed.

## 2021-01-07 NOTE — Assessment & Plan Note (Signed)
Tolerating atorvastatin well.  Continue current strength. 

## 2021-01-07 NOTE — Patient Instructions (Signed)

## 2021-01-07 NOTE — Assessment & Plan Note (Signed)
Blood pressure stable with current medications.  Continue current strength.  Low-sodium diet encouraged.

## 2021-01-07 NOTE — Progress Notes (Signed)
Frederick Powell - 45 y.o. male MRN [id number]  Date of birth: Jun 18, 1975  Subjective Chief Complaint  Patient presents with   Diabetes   Hypertension    HPI Frederick Powell is a 45 year old male here today for follow-up visit.  Reports he is doing well at this time.  He has been compliant with medications for management of hypertension and diabetes.  No side effects to current medications.  Mood has remained stable with current dose of Lexapro.    He has noted some issues with erectile dysfunction recently.  He is interested in trial of medication to help with this.  ROS:  A comprehensive ROS was completed and negative except as noted per HPI  No Known Allergies  No past medical history on file.  Past Surgical History:  Procedure Laterality Date   ROTATOR CUFF REPAIR      Social History   Socioeconomic History   Marital status: Single    Spouse name: Not on file   Number of children: Not on file   Years of education: Not on file   Highest education level: Not on file  Occupational History   Not on file  Tobacco Use   Smoking status: Some Days    Types: Cigarettes   Smokeless tobacco: Never  Substance and Sexual Activity   Alcohol use: Yes    Alcohol/week: 2.0 standard drinks    Types: 2 Cans of beer per week   Drug use: No   Sexual activity: Yes    Birth control/protection: None  Other Topics Concern   Not on file  Social History Narrative   Not on file   Social Determinants of Health   Financial Resource Strain: Not on file  Food Insecurity: Not on file  Transportation Needs: Not on file  Physical Activity: Not on file  Stress: Not on file  Social Connections: Not on file    Family History  Problem Relation Age of Onset   Colitis Mother     Health Maintenance  Topic Date Due   Pneumococcal Vaccine 1-89 Years old (1 - PCV) Never done   HIV Screening  Never done   COLONOSCOPY (Pts 45-53yrs Insurance coverage will need to be confirmed)  Never done    OPHTHALMOLOGY EXAM  06/06/2020   COVID-19 Vaccine (5 - Booster for Pfizer series) 08/12/2020   TETANUS/TDAP  01/07/2022 (Originally 06/18/1993)   HEMOGLOBIN A1C  07/07/2021   FOOT EXAM  08/06/2021   INFLUENZA VACCINE  Completed   Hepatitis C Screening  Completed   HPV VACCINES  Aged Out     ----------------------------------------------------------------------------------------------------------------------------------------------------------------------------------------------------------------- Physical Exam BP (!) 143/87 (BP Location: Left Arm, Patient Position: Sitting, Cuff Size: Normal)   Pulse 65   Temp 97.8 F (36.6 C)   Ht 6' (1.829 m)   Wt 197 lb 4.8 oz (89.5 kg)   SpO2 98%   BMI 26.76 kg/m   Physical Exam Constitutional:      Appearance: Normal appearance.  Eyes:     General: No scleral icterus. Cardiovascular:     Rate and Rhythm: Normal rate and regular rhythm.  Pulmonary:     Effort: Pulmonary effort is normal.     Breath sounds: Normal breath sounds.  Musculoskeletal:     Cervical back: Neck supple.  Neurological:     Mental Status: He is alert.  Psychiatric:        Mood and Affect: Mood normal.        Behavior: Behavior normal.    ------------------------------------------------------------------------------------------------------------------------------------------------------------------------------------------------------------------- Assessment and  Plan  Essential hypertension Blood pressure stable with current medications.  Continue current strength.  Low-sodium diet encouraged.  Type 2 diabetes mellitus with hyperglycemia, without long-term current use of insulin (HCC) Control blood sugars has improved.  Continue to work on dietary change.  Continue medications at current strength.  Hyperlipidemia associated with type 2 diabetes mellitus ([place]) Tolerating atorvastatin well.  Continue current strength.  Anxiety He continues to do well with  Lexapro at current strength.  Erectile dysfunction Discussed trial of tadalafil.  Side effects and precautions related to medication reviewed.   Meds ordered this encounter  Medications   glucose blood (ONETOUCH VERIO) test strip    Sig: USE AS DIRECTED TO TEST BLOOD SUGAR DAILY    Dispense:  100 strip    Refill:  2   escitalopram (LEXAPRO) 20 MG tablet    Sig: TAKE 1 TABLET(20 MG) BY MOUTH DAILY    Dispense:  90 tablet    Refill:  1   atorvastatin (LIPITOR) 10 MG tablet    Sig: TAKE 1 TABLET(10 MG) BY MOUTH DAILY    Dispense:  90 tablet    Refill:  3   tadalafil (CIALIS) 20 MG tablet    Sig: Take 0.5-1 tablets (10-20 mg total) by mouth every other day as needed for erectile dysfunction.    Dispense:  10 tablet    Refill:  11    Return in about 6 months (around 07/07/2021) for HTN/DM.    This visit occurred during the SARS-CoV-2 public health emergency.  Safety protocols were in place, including screening questions prior to the visit, additional usage of staff PPE, and extensive cleaning of exam room while observing appropriate contact time as indicated for disinfecting solutions.

## 2021-01-07 NOTE — Assessment & Plan Note (Addendum)
Control blood sugars has improved.  Continue to work on dietary change.  Continue medications at current strength.

## 2021-04-29 ENCOUNTER — Other Ambulatory Visit: Payer: Self-pay | Admitting: Family Medicine

## 2021-06-12 ENCOUNTER — Encounter: Payer: Self-pay | Admitting: Gastroenterology

## 2021-06-20 ENCOUNTER — Ambulatory Visit
Admission: EM | Admit: 2021-06-20 | Discharge: 2021-06-20 | Disposition: A | Discharge status: Home / Self Care | Payer: Managed Care, Other (non HMO) | Attending: Family Medicine | Admitting: Family Medicine

## 2021-06-20 DIAGNOSIS — Z202 Contact with and (suspected) exposure to infections with a predominantly sexual mode of transmission: Secondary | ICD-10-CM | POA: Insufficient documentation

## 2021-06-20 HISTORY — DX: Essential (primary) hypertension: I10

## 2021-06-20 HISTORY — DX: Type 2 diabetes mellitus without complications: E11.9
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
## 2021-06-20 NOTE — Discharge Instructions (Addendum)
We will notify you of any positive results ?

## 2021-06-20 NOTE — ED Triage Notes (Signed)
Pt states was contacted by a sex partner stating he has been exposed to gonorrhea. Denies sxs himself but wants testing and tx.  ?

## 2021-06-23 LAB — CYTOLOGY, (ORAL, ANAL, URETHRAL) ANCILLARY ONLY
Chlamydia: NEGATIVE
Comment: NEGATIVE
Comment: NEGATIVE
Comment: NORMAL
Neisseria Gonorrhea: NEGATIVE
Trichomonas: NEGATIVE

## 2021-07-08 ENCOUNTER — Ambulatory Visit: Payer: Managed Care, Other (non HMO) | Admitting: Family Medicine

## 2021-07-09 ENCOUNTER — Ambulatory Visit (AMBULATORY_SURGERY_CENTER): Payer: Managed Care, Other (non HMO) | Admitting: *Deleted

## 2021-07-09 VITALS — Ht 72.0 in | Wt 191.0 lb

## 2021-07-09 DIAGNOSIS — Z1211 Encounter for screening for malignant neoplasm of colon: Secondary | ICD-10-CM

## 2021-07-09 MED ORDER — CLENPIQ 10-3.5-12 MG-GM -GM/160ML PO SOLN: 1.0000 | ORAL | 0 refills | Status: DC

## 2021-07-09 NOTE — Progress Notes (Signed)
Patient is here in-person for PV. Patient denies any allergies to eggs or soy. Patient denies any problems with anesthesia/sedation. Patient is not on any oxygen at home. Patient is not taking any diet/weight loss medications or blood thinners. Patient is aware of our care-partner policy. EMMI education assigned to the patient for the procedure, sent to MyChart.   

## 2021-07-25 ENCOUNTER — Encounter: Payer: Self-pay | Admitting: Gastroenterology

## 2021-07-29 ENCOUNTER — Ambulatory Visit (AMBULATORY_SURGERY_CENTER): Payer: Managed Care, Other (non HMO) | Admitting: Gastroenterology

## 2021-07-29 ENCOUNTER — Encounter: Payer: Self-pay | Admitting: Gastroenterology

## 2021-07-29 VITALS — BP 157/95 | HR 50 | Temp 97.8°F | Resp 12 | Ht 72.0 in | Wt 191.0 lb

## 2021-07-29 DIAGNOSIS — K640 First degree hemorrhoids: Secondary | ICD-10-CM

## 2021-07-29 DIAGNOSIS — Z1211 Encounter for screening for malignant neoplasm of colon: Secondary | ICD-10-CM

## 2021-07-29 MED ORDER — SODIUM CHLORIDE 0.9 % IV SOLN: 500.0000 mL | Freq: Once | INTRAVENOUS | Status: DC

## 2021-07-29 NOTE — Progress Notes (Signed)
Pt's states no medical or surgical changes since previsit or office visit. 

## 2021-07-29 NOTE — Progress Notes (Signed)
GASTROENTEROLOGY PROCEDURE H&P NOTE   Primary Care Physician: Luetta Nutting, DO    Reason for Procedure:  Colon Cancer screening  Plan:    Colonoscopy  Patient is appropriate for endoscopic procedure(s) in the ambulatory ([place]) setting.  The nature of the procedure, as well as the risks, benefits, and alternatives were carefully and thoroughly reviewed with the patient. Ample time for discussion and questions allowed. The patient understood, was satisfied, and agreed to proceed.     HPI: Frederick Powell is a 46 y.o. male who presents for colonoscopy for routine Colon Cancer screening.  No active GI symptoms.  No known family history of colon cancer or related malignancy.  Patient is otherwise without complaints or active issues today.  Past Medical History:  Diagnosis Date   Diabetes mellitus without complication ([place])    Hypertension     Past Surgical History:  Procedure Laterality Date   ROTATOR CUFF REPAIR      Prior to Admission medications   Medication Sig Start Date End Date Taking? Authorizing Provider  atorvastatin (LIPITOR) 10 MG tablet TAKE 1 TABLET(10 MG) BY MOUTH DAILY 01/07/21  Yes Luetta Nutting, DO  Blood Glucose Monitoring Suppl (ONETOUCH VERIO) w/Device KIT Use to check glucose daily.  Please dispense appropriate lancets and strips. 02/24/19  Yes Luetta Nutting, DO  Continuous Blood Gluc Receiver (FREESTYLE LIBRE 14 DAY READER) DEVI Use to check glucose as needed each day. 11/29/19  Yes Luetta Nutting, DO  Continuous Blood Gluc Sensor (FREESTYLE LIBRE 14 DAY SENSOR) MISC Use to check glucose as needed daily. Change every 14 days. 11/29/19  Yes Luetta Nutting, DO  escitalopram (LEXAPRO) 20 MG tablet TAKE 1 TABLET(20 MG) BY MOUTH DAILY 01/07/21  Yes Luetta Nutting, DO  glucose blood (ONETOUCH VERIO) test strip USE AS DIRECTED TO TEST BLOOD SUGAR DAILY 01/07/21  Yes Luetta Nutting, DO  insulin glargine (LANTUS SOLOSTAR) 100 UNIT/ML Solostar Pen Inject 15 Units  into the skin daily. 08/06/20 06/10/23 Yes Zigmund Daniel, Einar Pheasant, DO  Lancets [facility] DELICA PLUS [id number]) MISC USE AS DIRECTED TO TEST GLUCOSE DAILY 04/29/21  Yes Luetta Nutting, DO  losartan (COZAAR) 100 MG tablet Take 1 tablet (100 mg total) by mouth daily. 08/06/20  Yes Luetta Nutting, DO  metFORMIN (GLUCOPHAGE) 500 MG tablet Take 1 tablet (500 mg total) by mouth 2 (two) times daily. 08/06/20  Yes Luetta Nutting, DO  NOVOFINE AUTOCOVER PEN NEEDLE 30G X 8 MM MISC INJECY 10 EACH INTO SKIN AS NEEDED 11/30/19  Yes Luetta Nutting, DO  acetaminophen (TYLENOL) 325 MG tablet Take 650 mg by mouth every 4 (four) hours as needed.    [provider]  BD PEN NEEDLE NANO 2ND GEN 32G X 4 MM MISC USE AS DIRECTED TO INJECT INSULIN 08/17/19   Luetta Nutting, DO  loratadine (CLARITIN) 10 MG tablet Take 10 mg by mouth daily.    [provider]  tadalafil (CIALIS) 20 MG tablet Take 0.5-1 tablets (10-20 mg total) by mouth every other day as needed for erectile dysfunction. 01/07/21   Luetta Nutting, DO    Current Outpatient Medications  Medication Sig Dispense Refill   atorvastatin (LIPITOR) 10 MG tablet TAKE 1 TABLET(10 MG) BY MOUTH DAILY 90 tablet 3   Blood Glucose Monitoring Suppl (ONETOUCH VERIO) w/Device KIT Use to check glucose daily.  Please dispense appropriate lancets and strips. 1 kit 0   Continuous Blood Gluc Receiver (FREESTYLE LIBRE 14 DAY READER) DEVI Use to check glucose as needed each day. 1 each 0  Continuous Blood Gluc Sensor (FREESTYLE LIBRE 14 DAY SENSOR) MISC Use to check glucose as needed daily. Change every 14 days. 6 each 0   escitalopram (LEXAPRO) 20 MG tablet TAKE 1 TABLET(20 MG) BY MOUTH DAILY 90 tablet 1   glucose blood (ONETOUCH VERIO) test strip USE AS DIRECTED TO TEST BLOOD SUGAR DAILY 100 strip 2   insulin glargine (LANTUS SOLOSTAR) 100 UNIT/ML Solostar Pen Inject 15 Units into the skin daily. 15 mL 3   Lancets (ONETOUCH DELICA PLUS [id number]) MISC USE AS DIRECTED TO TEST  GLUCOSE DAILY 100 each 1   losartan (COZAAR) 100 MG tablet Take 1 tablet (100 mg total) by mouth daily. 90 tablet 2   metFORMIN (GLUCOPHAGE) 500 MG tablet Take 1 tablet (500 mg total) by mouth 2 (two) times daily. 180 tablet 1   NOVOFINE AUTOCOVER PEN NEEDLE 30G X 8 MM MISC INJECY 10 EACH INTO SKIN AS NEEDED 100 each 1   acetaminophen (TYLENOL) 325 MG tablet Take 650 mg by mouth every 4 (four) hours as needed.     BD PEN NEEDLE NANO 2ND GEN 32G X 4 MM MISC USE AS DIRECTED TO INJECT INSULIN 100 each 2   loratadine (CLARITIN) 10 MG tablet Take 10 mg by mouth daily.     tadalafil (CIALIS) 20 MG tablet Take 0.5-1 tablets (10-20 mg total) by mouth every other day as needed for erectile dysfunction. 10 tablet 11   Current Facility-Administered Medications  Medication Dose Route Frequency Provider Last Rate Last Admin   0.9 %  sodium chloride infusion  500 mL Intravenous Once Cirigliano, Vito V, DO        Allergies as of 07/29/2021   (No Known Allergies)    Family History  Problem Relation Age of Onset   Colitis Mother    Colon cancer Neg Hx    Esophageal cancer Neg Hx    Stomach cancer Neg Hx    Rectal cancer Neg Hx     Social History   Socioeconomic History   Marital status: Single    Spouse name: Not on file   Number of children: Not on file   Years of education: Not on file   Highest education level: Not on file  Occupational History   Not on file  Tobacco Use   Smoking status: Every Day    Packs/day: 0.50    Types: Cigarettes   Smokeless tobacco: Never  Vaping Use   Vaping Use: Never used  Substance and Sexual Activity   Alcohol use: Not Currently    Alcohol/week: 5.0 standard drinks of alcohol    Types: 5 Standard drinks or equivalent per week   Drug use: No   Sexual activity: Yes    Birth control/protection: None  Other Topics Concern   Not on file  Social History Narrative   Not on file   Social Determinants of Health   Financial Resource Strain: Not on file   Food Insecurity: Not on file  Transportation Needs: Not on file  Physical Activity: Not on file  Stress: Not on file  Social Connections: Not on file  Intimate Partner Violence: Not on file    Physical Exam: Vital signs in last 24 hours: _0  133/79   Pulse (!) 55   Temp 97.8 F (36.6 C)   Ht 6' (1.829 m)   Wt 191 lb (86.6 kg)   SpO2 98%   BMI 25.90 kg/m  GEN: NAD EYE: Sclerae anicteric ENT: MMM CV: Non-tachycardic Pulm: CTA b/l GI:  Soft, NT/ND NEURO:  Alert & Oriented x 3   Gerrit Heck, DO [facility]   07/29/2021 9:43 AM

## 2021-07-29 NOTE — Patient Instructions (Signed)
USE METAMUCIL,CITRUCEL,KONSYL, OR FIBERCON  HANDOUT ON HEMORRHOIDS GIVEN TO YOU TODAY    YOU HAD AN ENDOSCOPIC PROCEDURE TODAY AT THE [facility]:   Refer to the procedure report that was given to you for any specific questions about what was found during the examination.  If the procedure report does not answer your questions, please call your gastroenterologist to clarify.  If you requested that your care partner not be given the details of your procedure findings, then the procedure report has been included in a sealed envelope for you to review at your convenience later.  YOU SHOULD EXPECT: Some feelings of bloating in the abdomen. Passage of more gas than usual.  Walking can help get rid of the air that was put into your GI tract during the procedure and reduce the bloating. If you had a lower endoscopy (such as a colonoscopy or flexible sigmoidoscopy) you may notice spotting of blood in your stool or on the toilet paper. If you underwent a bowel prep for your procedure, you may not have a normal bowel movement for a few days.  Please Note:  You might notice some irritation and congestion in your nose or some drainage.  This is from the oxygen used during your procedure.  There is no need for concern and it should clear up in a day or so.  SYMPTOMS TO REPORT IMMEDIATELY:  Following lower endoscopy (colonoscopy or flexible sigmoidoscopy):  Excessive amounts of blood in the stool  Significant tenderness or worsening of abdominal pains  Swelling of the abdomen that is new, acute  Fever of 100F or higher  For urgent or emergent issues, a gastroenterologist can be reached at any hour by calling [phone]. Do not use MyChart messaging for urgent concerns.    DIET:  We do recommend a small meal at first, but then you may proceed to your regular diet.  Drink plenty of fluids but you should avoid alcoholic beverages for 24 hours.  ACTIVITY:  You should plan to take it  easy for the rest of today and you should NOT DRIVE or use heavy machinery until tomorrow (because of the sedation medicines used during the test).    FOLLOW UP: Our staff will call the number listed on your records 24-72 hours following your procedure to check on you and address any questions or concerns that you may have regarding the information given to you following your procedure. If we do not reach you, we will leave a message.  We will attempt to reach you two times.  During this call, we will ask if you have developed any symptoms of COVID 19. If you develop any symptoms (ie: fever, flu-like symptoms, shortness of breath, cough etc.) before then, please call [phone].  If you test positive for Covid 19 in the 2 weeks post procedure, please call and report this information to [place].    If any biopsies were taken you will be contacted by phone or by letter within the next 1-3 weeks.  Please call us at [phone] if you have not heard about the biopsies in 3 weeks.    SIGNATURES/CONFIDENTIALITY: You and/or your care partner have signed paperwork which will be entered into your electronic medical record.  These signatures attest to the fact that that the information above on your After Visit Summary has been reviewed and is understood.  Full responsibility of the confidentiality of this discharge information lies with you and/or your care-partner.

## 2021-07-29 NOTE — Op Note (Signed)
[facility] Patient Name: Frederick Powell Procedure Date: 07/29/2021 9:43 AM MRN: [id number] Endoscopist: Gerrit Heck , MD Age: 46 Referring MD:  Date of Birth: [birth year] Gender: Male Account #: [id number] Procedure:                Colonoscopy Indications:              Screening for colorectal malignant neoplasm, This                            is the patient's first colonoscopy Medicines:                Monitored Anesthesia Care Procedure:                Pre-Anesthesia Assessment:                           - Prior to the procedure, a History and Physical                            was performed, and patient medications and                            allergies were reviewed. The patient's tolerance of                            previous anesthesia was also reviewed. The risks                            and benefits of the procedure and the sedation                            options and risks were discussed with the patient.                            All questions were answered, and informed consent                            was obtained. Prior Anticoagulants: The patient has                            taken no previous anticoagulant or antiplatelet                            agents. ASA Grade Assessment: II - A patient with                            mild systemic disease. After reviewing the risks                            and benefits, the patient was deemed in                            satisfactory condition to undergo the procedure.  After obtaining informed consent, the colonoscope                            was passed under direct vision. Throughout the                            procedure, the patient's blood pressure, pulse, and                            oxygen saturations were monitored continuously. The                            CF [id number] #[id number] was introduced through the anus                            and advanced to the the  terminal ileum. The                            colonoscopy was performed without difficulty. The                            patient tolerated the procedure well. The quality                            of the bowel preparation was good. The terminal                            ileum, ileocecal valve, appendiceal orifice, and                            rectum were photographed. Scope In: 9:52:14 AM Scope Out: 10:08:28 AM Scope Withdrawal Time: 0 hours 14 minutes 27 seconds  Total Procedure Duration: 0 hours 16 minutes 14 seconds  Findings:                 The perianal and digital rectal examinations were                            normal.                           The entire colon appeared normal.                           Non-bleeding internal hemorrhoids were found during                            retroflexion. The hemorrhoids were small and Grade                            II (internal hemorrhoids that prolapse but reduce                            spontaneously).  The terminal ileum appeared normal. Complications:            No immediate complications. Estimated Blood Loss:     Estimated blood loss: none. Impression:               - The entire examined colon is normal.                           - Non-bleeding internal hemorrhoids.                           - The examined portion of the ileum was normal.                           - No specimens collected. Recommendation:           - Patient has a contact number available for                            emergencies. The signs and symptoms of potential                            delayed complications were discussed with the                            patient. Return to normal activities tomorrow.                            Written discharge instructions were provided to the                            patient.                           - Resume previous diet.                           - Continue present  medications.                           - Repeat colonoscopy in 10 years for screening                            purposes.                           - Return to GI office PRN.                           - Use fiber, for example Citrucel, Fibercon, Konsyl                            or Metamucil.                           - Internal hemorrhoids were noted on this study and  may be amenable to hemorrhoid band ligation. If you                            are interested in further treatment of these                            hemorrhoids with band ligation, please contact my                            clinic to set up an appointment for evaluation and                            treatment. Doristine Locks, MD 07/29/2021 10:12:46 AM

## 2021-07-29 NOTE — Progress Notes (Signed)
Sedate, gd SR, tolerated procedure well, VSS, report to RN 

## 2021-07-30 ENCOUNTER — Telehealth: Payer: Self-pay | Admitting: *Deleted

## 2021-07-30 NOTE — Telephone Encounter (Signed)
Attempted f/u phone call. No answer. No voicemail box set up, unable to leave message.  
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
## 2021-09-25 ENCOUNTER — Other Ambulatory Visit: Payer: Self-pay

## 2021-09-25 MED ORDER — LANTUS SOLOSTAR 100 UNIT/ML ~~LOC~~ SOPN: 15.0000 [IU] | PEN_INJECTOR | Freq: Every day | SUBCUTANEOUS | 0 refills | Status: DC

## 2022-01-22 ENCOUNTER — Telehealth: Payer: Self-pay | Admitting: Family Medicine

## 2022-01-23 NOTE — Telephone Encounter (Signed)
Pls contact the patient to schedule appt. Last seen 12/2020.0 Sending 30 day med refill.

## 2022-01-23 NOTE — Telephone Encounter (Signed)
Called patient, spoke to patients mom, mom states that patients blood sugars have been up for the last few days, [phone], I spoke with CC, and she stated she'll give mom a call, patients mom states that she will get patient to call back to schedule appointment for medication refill for Lipitor, thanks.

## 2022-01-27 ENCOUNTER — Ambulatory Visit (INDEPENDENT_AMBULATORY_CARE_PROVIDER_SITE_OTHER): Payer: Managed Care, Other (non HMO) | Admitting: Family Medicine

## 2022-01-27 ENCOUNTER — Encounter: Payer: Self-pay | Admitting: Family Medicine

## 2022-01-27 VITALS — BP 155/77 | HR 68 | Ht 72.0 in | Wt 189.0 lb

## 2022-01-27 DIAGNOSIS — I10 Essential (primary) hypertension: Secondary | ICD-10-CM | POA: Diagnosis not present

## 2022-01-27 DIAGNOSIS — F419 Anxiety disorder, unspecified: Secondary | ICD-10-CM

## 2022-01-27 DIAGNOSIS — Z23 Encounter for immunization: Secondary | ICD-10-CM | POA: Diagnosis not present

## 2022-01-27 DIAGNOSIS — E1169 Type 2 diabetes mellitus with other specified complication: Secondary | ICD-10-CM

## 2022-01-27 DIAGNOSIS — E1165 Type 2 diabetes mellitus with hyperglycemia: Secondary | ICD-10-CM

## 2022-01-27 DIAGNOSIS — E785 Hyperlipidemia, unspecified: Secondary | ICD-10-CM

## 2022-01-27 LAB — POCT UA - MICROALBUMIN
Albumin/Creatinine Ratio, Urine, POC: <30
Creatinine, POC: 100 mg/dL
Microalbumin Ur, POC: 30 mg/L

## 2022-01-27 LAB — POCT GLYCOSYLATED HEMOGLOBIN (HGB A1C): Hemoglobin A1C: 11.8 % — AB (ref 4.0–5.6)

## 2022-01-27 MED ORDER — METFORMIN HCL 500 MG PO TABS: 500.0000 mg | ORAL_TABLET | Freq: Two times a day (BID) | ORAL | 1 refills | Status: DC

## 2022-01-27 MED ORDER — LOSARTAN POTASSIUM 100 MG PO TABS: 100.0000 mg | ORAL_TABLET | Freq: Every day | ORAL | 2 refills | Status: AC

## 2022-01-27 MED ORDER — TIRZEPATIDE 5 MG/0.5ML ~~LOC~~ SOAJ: 5.0000 mg | SUBCUTANEOUS | 0 refills | Status: DC

## 2022-01-27 MED ORDER — ATORVASTATIN CALCIUM 10 MG PO TABS: ORAL_TABLET | ORAL | 1 refills | Status: DC

## 2022-01-27 MED ORDER — TIRZEPATIDE 2.5 MG/0.5ML ~~LOC~~ SOAJ: 2.5000 mg | SUBCUTANEOUS | 0 refills | Status: DC

## 2022-01-27 MED ORDER — TIRZEPATIDE 7.5 MG/0.5ML ~~LOC~~ SOAJ: 7.5000 mg | SUBCUTANEOUS | 0 refills | Status: DC

## 2022-01-27 MED ORDER — ESCITALOPRAM OXALATE 20 MG PO TABS: ORAL_TABLET | ORAL | 1 refills | Status: DC

## 2022-01-27 NOTE — Patient Instructions (Signed)
Continue insulin.  Restart all previous medications.  Add Mounjaro 2.5mg  x1 month then increase to 5mg  x1 month then increase to 7.5mg .  See me again in 3 months.  BP check in 2 weeks.

## 2022-01-27 NOTE — Assessment & Plan Note (Signed)
Diabetes is poorly controlled.  Continue insulin current strength.  Adding metformin back on.  Adding Mounjaro as well and titrate based on response and tolerance.  Encourage dietary change.

## 2022-01-27 NOTE — Progress Notes (Signed)
Frederick Powell - 46 y.o. male MRN [id number]  Date of birth: Jun 18, 1975  Subjective No chief complaint on file.   HPI Frederick Powell is a 46 year old male here today for follow-up visit.  He has not been seen in our clinic in over a year.    Reports that his blood sugars have been more elevated recently.  He reports that he has been using his insulin fairly regularly.  He has been out of metformin.  He does admit that diet has been pretty poor.  Activity level remains pretty good.  Denies symptoms related to his diabetes.  Blood pressure is elevated.  Has been off losartan for several months.  He denies symptoms including chest pain, shortness of breath, palpitations, headaches or vision changes.     Previously on Lexapro for management of anxiety.  He was doing well with this.  He would like to restart this.  ROS:  A comprehensive ROS was completed and negative except as noted per HPI   Past Medical History:  Diagnosis Date   Diabetes mellitus without complication ([place])    Hypertension     Past Surgical History:  Procedure Laterality Date   ROTATOR CUFF REPAIR      Social History   Socioeconomic History   Marital status: Single    Spouse name: Not on file   Number of children: Not on file   Years of education: Not on file   Highest education level: Not on file  Occupational History   Not on file  Tobacco Use   Smoking status: Every Day    Packs/day: 0.50    Types: Cigarettes   Smokeless tobacco: Never  Vaping Use   Vaping Use: Never used  Substance and Sexual Activity   Alcohol use: Not Currently    Alcohol/week: 5.0 standard drinks of alcohol    Types: 5 Standard drinks or equivalent per week   Drug use: No   Sexual activity: Yes    Birth control/protection: None  Other Topics Concern   Not on file  Social History Narrative   Not on file   Social Determinants of Health   Financial Resource Strain: Not on file  Food Insecurity: Not on file   Transportation Needs: Not on file  Physical Activity: Not on file  Stress: Not on file  Social Connections: Not on file    Family History  Problem Relation Age of Onset   Colitis Mother    Colon cancer Neg Hx    Esophageal cancer Neg Hx    Stomach cancer Neg Hx    Rectal cancer Neg Hx     Health Maintenance  Topic Date Due   HIV Screening  Never done   Diabetic kidney evaluation - eGFR measurement  08/06/2021   FOOT EXAM  08/06/2021   INFLUENZA VACCINE  09/09/2021   COVID-19 Vaccine (5 - 2023-24 season) 10/10/2021   OPHTHALMOLOGY EXAM  03/12/2022 (Originally 06/06/2020)   HEMOGLOBIN A1C  07/29/2022   Diabetic kidney evaluation - Urine ACR  01/28/2023   COLONOSCOPY (Pts 45-62yr Insurance coverage will need to be confirmed)  07/30/2031   DTaP/Tdap/Td (2 - Td or Tdap) 01/28/2032   Hepatitis C Screening  Completed   HPV VACCINES  Aged Out     ----------------------------------------------------------------------------------------------------------------------------------------------------------------------------------------------------------------- Physical Exam BP (!) 155/77 (BP Location: Left Arm, Patient Position: Sitting, Cuff Size: Normal)   Pulse 68   Ht 6' (1.829 m)   Wt 189 lb (85.7 kg)   SpO2 100%   BMI 25.63  kg/m   Physical Exam Constitutional:      Appearance: Normal appearance.  HENT:     Head: Normocephalic and atraumatic.  Eyes:     General: No scleral icterus. Cardiovascular:     Rate and Rhythm: Normal rate and regular rhythm.  Pulmonary:     Effort: Pulmonary effort is normal.     Breath sounds: Normal breath sounds.  Neurological:     Mental Status: He is alert.  Psychiatric:        Mood and Affect: Mood normal.        Behavior: Behavior normal.      ------------------------------------------------------------------------------------------------------------------------------------------------------------------------------------------------------------------- Assessment and Plan  Essential hypertension Blood pressure elevated.  Adding losartan back on.  Return in 2 weeks for blood pressure recheck.  Type 2 diabetes mellitus with hyperglycemia, without long-term current use of insulin (HCC) Diabetes is poorly controlled.  Continue insulin current strength.  Adding metformin back on.  Adding Mounjaro as well and titrate based on response and tolerance.  Encourage dietary change.  Anxiety Adding Lexapro at home.  Recommend starting at half a tab for 1 to 2 weeks before increasing back to his previous dose.     Meds ordered this encounter  Medications   tirzepatide (MOUNJARO) 2.5 MG/0.5ML Pen    Sig: Inject 2.5 mg into the skin once a week. Increase to 40m after first month.    Dispense:  2 mL    Refill:  0   tirzepatide (MOUNJARO) 5 MG/0.5ML Pen    Sig: Inject 5 mg into the skin once a week. Increase to 7.512mafter 1 month.    Dispense:  2 mL    Refill:  0   tirzepatide (MOUNJARO) 7.5 MG/0.5ML Pen    Sig: Inject 7.5 mg into the skin once a week.    Dispense:  6 mL    Refill:  0   atorvastatin (LIPITOR) 10 MG tablet    Sig: TAKE 1 TABLET(10 MG) BY MOUTH DAILY    Dispense:  90 tablet    Refill:  1   escitalopram (LEXAPRO) 20 MG tablet    Sig: TAKE 1 TABLET(20 MG) BY MOUTH DAILY    Dispense:  90 tablet    Refill:  1   losartan (COZAAR) 100 MG tablet    Sig: Take 1 tablet (100 mg total) by mouth daily.    Dispense:  90 tablet    Refill:  2   metFORMIN (GLUCOPHAGE) 500 MG tablet    Sig: Take 1 tablet (500 mg total) by mouth 2 (two) times daily.    Dispense:  180 tablet    Refill:  1    Return in about 3 months (around 04/28/2022) for F/u T2DM.    This visit occurred during the SARS-CoV-2 public health emergency.   Safety protocols were in place, including screening questions prior to the visit, additional usage of staff PPE, and extensive cleaning of exam room while observing appropriate contact time as indicated for disinfecting solutions.

## 2022-01-27 NOTE — Assessment & Plan Note (Signed)
Adding Lexapro at home.  Recommend starting at half a tab for 1 to 2 weeks before increasing back to his previous dose.

## 2022-01-27 NOTE — Assessment & Plan Note (Signed)
Blood pressure elevated.  Adding losartan back on.  Return in 2 weeks for blood pressure recheck.

## 2022-01-28 LAB — COMPLETE METABOLIC PANEL WITH GFR
AG Ratio: 1.5 (calc) (ref 1.0–2.5)
ALT: 14 U/L (ref 9–46)
AST: 14 U/L (ref 10–40)
Albumin: 4.4 g/dL (ref 3.6–5.1)
Alkaline phosphatase (APISO): 41 U/L (ref 36–130)
BUN: 23 mg/dL (ref 7–25)
CO2: 28 mmol/L (ref 20–32)
Calcium: 9.7 mg/dL (ref 8.6–10.3)
Chloride: 102 mmol/L (ref 98–110)
Creat: 1.16 mg/dL (ref 0.60–1.29)
Globulin: 2.9 g/dL (calc) (ref 1.9–3.7)
Glucose, Bld: 173 mg/dL — ABNORMAL HIGH (ref 65–99)
Potassium: 4 mmol/L (ref 3.5–5.3)
Sodium: 139 mmol/L (ref 135–146)
Total Bilirubin: 0.4 mg/dL (ref 0.2–1.2)
Total Protein: 7.3 g/dL (ref 6.1–8.1)
eGFR: 78 mL/min/{1.73_m2} (ref >=60)

## 2022-01-28 LAB — CBC WITH DIFFERENTIAL/PLATELET
Absolute Monocytes: 490 cells/uL (ref 200–950)
Basophils Absolute: 63 cells/uL (ref 0–200)
Basophils Relative: 0.9 %
Eosinophils Absolute: 210 cells/uL (ref 15–500)
Eosinophils Relative: 3 %
HCT: 44.1 % (ref 38.5–50.0)
Hemoglobin: 15 g/dL (ref 13.2–17.1)
Lymphs Abs: 2723 cells/uL (ref 850–3900)
MCH: 29.6 pg (ref 27.0–33.0)
MCHC: 34 g/dL (ref 32.0–36.0)
MCV: 87.2 fL (ref 80.0–100.0)
MPV: 10.4 fL (ref 7.5–12.5)
Monocytes Relative: 7 %
Neutro Abs: 3514 cells/uL (ref 1500–7800)
Neutrophils Relative %: 50.2 %
Platelets: 364 10*3/uL (ref 140–400)
RBC: 5.06 10*6/uL (ref 4.20–5.80)
RDW: 12.2 % (ref 11.0–15.0)
Total Lymphocyte: 38.9 %
WBC: 7 10*3/uL (ref 3.8–10.8)

## 2022-01-28 LAB — LIPID PANEL W/REFLEX DIRECT LDL
Cholesterol: 106 mg/dL (ref <200)
HDL: 51 mg/dL (ref >=40)
LDL Cholesterol (Calc): 27 mg/dL (calc)
Non-HDL Cholesterol (Calc): 55 mg/dL (calc) (ref <130)
Total CHOL/HDL Ratio: 2.1 (calc) (ref <5.0)
Triglycerides: 227 mg/dL — ABNORMAL HIGH (ref <150)

## 2022-02-10 ENCOUNTER — Ambulatory Visit: Payer: Managed Care, Other (non HMO)

## 2022-04-28 ENCOUNTER — Ambulatory Visit: Payer: Managed Care, Other (non HMO) | Admitting: Family Medicine

## 2022-07-05 ENCOUNTER — Other Ambulatory Visit: Payer: Self-pay | Admitting: Family Medicine

## 2022-07-08 ENCOUNTER — Other Ambulatory Visit: Payer: Self-pay

## 2022-07-08 DIAGNOSIS — F419 Anxiety disorder, unspecified: Secondary | ICD-10-CM

## 2022-07-08 DIAGNOSIS — E1165 Type 2 diabetes mellitus with hyperglycemia: Secondary | ICD-10-CM

## 2022-07-08 DIAGNOSIS — E1169 Type 2 diabetes mellitus with other specified complication: Secondary | ICD-10-CM

## 2022-07-08 MED ORDER — ATORVASTATIN CALCIUM 10 MG PO TABS: ORAL_TABLET | ORAL | 0 refills | Status: DC

## 2022-07-08 MED ORDER — ESCITALOPRAM OXALATE 20 MG PO TABS: ORAL_TABLET | ORAL | 0 refills | Status: DC

## 2022-07-08 MED ORDER — LANTUS SOLOSTAR 100 UNIT/ML ~~LOC~~ SOPN
15.0000 [IU] | PEN_INJECTOR | Freq: Every day | SUBCUTANEOUS | 0 refills | Status: DC
Start: 2022-07-08 — End: 2023-06-01

## 2022-07-08 MED ORDER — METFORMIN HCL 500 MG PO TABS
500.0000 mg | ORAL_TABLET | Freq: Two times a day (BID) | ORAL | 0 refills | Status: DC
Start: 2022-07-08 — End: 2022-07-27

## 2022-07-13 ENCOUNTER — Ambulatory Visit: Payer: Managed Care, Other (non HMO) | Admitting: Family Medicine

## 2022-07-27 ENCOUNTER — Other Ambulatory Visit: Payer: Self-pay | Admitting: Family Medicine

## 2022-07-27 ENCOUNTER — Telehealth: Payer: Self-pay | Admitting: Family Medicine

## 2022-07-27 DIAGNOSIS — E785 Hyperlipidemia, unspecified: Secondary | ICD-10-CM

## 2022-07-27 DIAGNOSIS — E1165 Type 2 diabetes mellitus with hyperglycemia: Secondary | ICD-10-CM

## 2022-07-27 MED ORDER — ATORVASTATIN CALCIUM 10 MG PO TABS
ORAL_TABLET | ORAL | 0 refills | Status: DC
Start: 2022-07-27 — End: 2023-06-01

## 2022-07-27 MED ORDER — METFORMIN HCL 500 MG PO TABS: 500.0000 mg | ORAL_TABLET | Freq: Two times a day (BID) | ORAL | 0 refills | Status: DC

## 2022-07-27 NOTE — Telephone Encounter (Unsigned)
Patient called he states he lost his medication for Atorvastatin 10mg  and Metformin 500mg  He requests that it be sent to [facility]  [address]                                                                    [place] phone number is [phone]

## 2022-07-27 NOTE — Telephone Encounter (Signed)
Updated rx sent in.

## 2022-11-25 ENCOUNTER — Telehealth: Payer: Self-pay | Admitting: Family Medicine

## 2022-11-25 NOTE — Telephone Encounter (Signed)
-----   Message from Tanaina I sent at 11/24/2022  3:01 PM EDT ----- Good afternoon,  The document below has been scanned into the wrong patients chart. Please advise.  Tosha  Patient Referral Coordinator

## 2022-11-25 NOTE — Telephone Encounter (Signed)
The document has been submitted for a correction to be moved to the correct pt's chart.

## 2022-11-27 ENCOUNTER — Other Ambulatory Visit: Payer: Self-pay

## 2022-11-27 DIAGNOSIS — F419 Anxiety disorder, unspecified: Secondary | ICD-10-CM

## 2022-11-27 NOTE — Telephone Encounter (Signed)
Requesting rx rf of escitalopram 20mg  Last written 07/08/2022 Last OV 01/27/2022 No upcoming appt schld.

## 2022-11-30 MED ORDER — ESCITALOPRAM OXALATE 20 MG PO TABS: ORAL_TABLET | ORAL | 0 refills | Status: DC

## 2022-11-30 NOTE — Telephone Encounter (Signed)
Needs OV, sending 30 days in.

## 2022-12-01 NOTE — Telephone Encounter (Signed)
Attempted call to patient voice mail full. Could not leave a voicemail message.

## 2022-12-01 NOTE — Telephone Encounter (Signed)
Attempted call to patient - mail box full - could not leave a voicemail message.

## 2022-12-02 ENCOUNTER — Encounter: Payer: Self-pay | Admitting: Family Medicine

## 2022-12-02 NOTE — Telephone Encounter (Signed)
Letter placed in mail to patient.  

## 2022-12-02 NOTE — Telephone Encounter (Signed)
Attempted call again to patient. Mail box still full. Could not leave a voice mail message . Will mail patient a letter.

## 2023-01-30 ENCOUNTER — Ambulatory Visit
Admission: EM | Admit: 2023-01-30 | Discharge: 2023-01-30 | Disposition: A | Discharge status: Home / Self Care | Payer: 59 | Attending: Physician Assistant | Admitting: Physician Assistant

## 2023-01-30 ENCOUNTER — Other Ambulatory Visit: Payer: Self-pay

## 2023-01-30 ENCOUNTER — Encounter: Payer: Self-pay | Admitting: Emergency Medicine

## 2023-01-30 DIAGNOSIS — J069 Acute upper respiratory infection, unspecified: Secondary | ICD-10-CM | POA: Diagnosis not present

## 2023-01-30 LAB — POCT INFLUENZA A/B
Influenza A, POC: NEGATIVE
Influenza B, POC: NEGATIVE
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
## 2023-01-30 NOTE — ED Triage Notes (Signed)
Pt here for cough, body aches and nasal congestion x 3 days

## 2023-02-23 ENCOUNTER — Other Ambulatory Visit: Payer: Self-pay

## 2023-02-23 ENCOUNTER — Telehealth: Payer: Self-pay

## 2023-02-23 DIAGNOSIS — F419 Anxiety disorder, unspecified: Secondary | ICD-10-CM

## 2023-02-23 NOTE — Telephone Encounter (Signed)
 Pharmacy [facility] [place] requesting a rx rf of escitalopram 20mg   Patient last OV 01/2022 Attempted call to patient to schedule visit Mail box full and could not leave a voice  mail message.

## 2023-02-24 NOTE — Telephone Encounter (Signed)
 Attempted call to patient. Mail box full - can not accept any messages - could not leave a voice mail message.

## 2023-03-03 NOTE — Telephone Encounter (Signed)
Attempted call to patient. Mail box full. Could not leave a voice mail message.  

## 2023-03-04 NOTE — Telephone Encounter (Signed)
Attempted call to patient. Mail box full. Could not leave a voice mail message.  

## 2023-03-04 NOTE — Telephone Encounter (Signed)
Letter placed in mail to patient.  

## 2023-04-28 ENCOUNTER — Other Ambulatory Visit: Payer: Self-pay | Admitting: Family Medicine

## 2023-04-28 NOTE — Telephone Encounter (Signed)
 Pls contact the pt to schedule appt with Dr. Ashley Royalty for DM & HTN. Last seen 2023. Unable to refill medication without kept appt. Thx

## 2023-04-28 NOTE — Telephone Encounter (Signed)
 Called patient to schedule appt, VM is full.

## 2023-05-05 ENCOUNTER — Other Ambulatory Visit (HOSPITAL_COMMUNITY): Payer: Self-pay

## 2023-05-26 ENCOUNTER — Other Ambulatory Visit: Payer: Self-pay | Admitting: Family Medicine

## 2023-05-26 DIAGNOSIS — E1165 Type 2 diabetes mellitus with hyperglycemia: Secondary | ICD-10-CM

## 2023-05-26 DIAGNOSIS — F419 Anxiety disorder, unspecified: Secondary | ICD-10-CM

## 2023-05-26 NOTE — Telephone Encounter (Signed)
 Copied from CRM [id number]. Topic: Clinical - Medication Refill >> May 26, 2023  1:56 PM Blair Bumpers wrote: Most Recent Primary Care Visit:  Provider: Adela Holter  Department: [facility]  Visit Type: OFFICE VISIT  Date: 01/27/2022  Medication: escitalopram (LEXAPRO) 20 MG tablet, metFORMIN (GLUCOPHAGE) 500 MG tablet, losartan (COZAAR) 100 MG tablet, & insulin glargine (LANTUS SOLOSTAR) 100 UNIT/ML Solostar Pen  Has the patient contacted their pharmacy? No (Agent: If no, request that the patient contact the pharmacy for the refill. If patient does not wish to contact the pharmacy document the reason why and proceed with request.) (Agent: If yes, when and what did the pharmacy advise?)  Is this the correct pharmacy for this prescription? Yes If no, delete pharmacy and type the correct one.  This is the patient's preferred pharmacy:  [id number] [place], [place] - [phone] AT [facility] & [address] [place] [address] Phone: [phone] Fax: [phone]   Has the prescription been filled recently? No  Is the patient out of the medication? Yes  Has the patient been seen for an appointment in the last year OR does the patient have an upcoming appointment? Yes  Can we respond through MyChart? Yes  Agent: Please be advised that Rx refills may take up to 3 business days. We ask that you follow-up with your pharmacy.

## 2023-05-27 ENCOUNTER — Other Ambulatory Visit: Payer: Self-pay | Admitting: Family Medicine

## 2023-05-27 DIAGNOSIS — E1165 Type 2 diabetes mellitus with hyperglycemia: Secondary | ICD-10-CM

## 2023-05-27 NOTE — Telephone Encounter (Signed)
Office visit is scheduled.

## 2023-05-27 NOTE — Telephone Encounter (Signed)
 Requesting rx rf of  escitalopram 20mg  tab- last written on 11/30/22 as qty#30 tablet Lantus solostar 100u - last written 07/08/2022 As 15ml Losartan 100mg  tab - last written 01/27/2022 Metfromin 500mg  tab -last written 07/27/2022 as qty #60 Last OV 01/27/2022 Upcoming appt =06/01/2023

## 2023-05-27 NOTE — Telephone Encounter (Signed)
 Patient scheduled for 06/01/23, thanks.

## 2023-06-01 ENCOUNTER — Ambulatory Visit (INDEPENDENT_AMBULATORY_CARE_PROVIDER_SITE_OTHER): Admitting: Family Medicine

## 2023-06-01 ENCOUNTER — Encounter: Payer: Self-pay | Admitting: Family Medicine

## 2023-06-01 VITALS — BP 110/74 | HR 95 | Ht 72.0 in | Wt 180.0 lb

## 2023-06-01 DIAGNOSIS — E1165 Type 2 diabetes mellitus with hyperglycemia: Secondary | ICD-10-CM

## 2023-06-01 DIAGNOSIS — Z7984 Long term (current) use of oral hypoglycemic drugs: Secondary | ICD-10-CM | POA: Diagnosis not present

## 2023-06-01 DIAGNOSIS — E1169 Type 2 diabetes mellitus with other specified complication: Secondary | ICD-10-CM

## 2023-06-01 DIAGNOSIS — E785 Hyperlipidemia, unspecified: Secondary | ICD-10-CM

## 2023-06-01 DIAGNOSIS — I10 Essential (primary) hypertension: Secondary | ICD-10-CM

## 2023-06-01 DIAGNOSIS — Z794 Long term (current) use of insulin: Secondary | ICD-10-CM | POA: Diagnosis not present

## 2023-06-01 DIAGNOSIS — F419 Anxiety disorder, unspecified: Secondary | ICD-10-CM

## 2023-06-01 LAB — POCT GLYCOSYLATED HEMOGLOBIN (HGB A1C): HbA1c, POC (controlled diabetic range): 14.4 % — AB (ref 0.0–7.0)

## 2023-06-01 MED ORDER — ATORVASTATIN CALCIUM 10 MG PO TABS: ORAL_TABLET | ORAL | 1 refills | Status: AC

## 2023-06-01 MED ORDER — TIRZEPATIDE 7.5 MG/0.5ML ~~LOC~~ SOAJ
7.5000 mg | SUBCUTANEOUS | 0 refills | Status: DC
Start: 2023-06-01 — End: 2023-09-07

## 2023-06-01 MED ORDER — LANTUS SOLOSTAR 100 UNIT/ML ~~LOC~~ SOPN
20.0000 [IU] | PEN_INJECTOR | Freq: Every day | SUBCUTANEOUS | 1 refills | Status: AC
Start: 2023-06-01 — End: 2023-08-30

## 2023-06-01 MED ORDER — FREESTYLE LIBRE 3 SENSOR MISC: 1.0000 | 3 refills | Status: AC

## 2023-06-01 MED ORDER — TIRZEPATIDE 5 MG/0.5ML ~~LOC~~ SOAJ: 5.0000 mg | SUBCUTANEOUS | 0 refills | Status: DC

## 2023-06-01 MED ORDER — TIRZEPATIDE 2.5 MG/0.5ML ~~LOC~~ SOAJ: 2.5000 mg | SUBCUTANEOUS | 0 refills | Status: DC

## 2023-06-01 MED ORDER — METFORMIN HCL 500 MG PO TABS: 500.0000 mg | ORAL_TABLET | Freq: Two times a day (BID) | ORAL | 1 refills | Status: AC

## 2023-06-01 MED ORDER — ESCITALOPRAM OXALATE 20 MG PO TABS: ORAL_TABLET | ORAL | 1 refills | Status: AC

## 2023-06-01 NOTE — Assessment & Plan Note (Signed)
 BP well controlled on re-check.  Continue losartan  at current strength.

## 2023-06-01 NOTE — Patient Instructions (Signed)
 Increase lantus  to 20 units daily  Start Mounjaro  2.5 weekly x4 weeks, then 5mg  weekly x4 weeks then 7.5mg  weekly.  See me again in 3 months.

## 2023-06-01 NOTE — Assessment & Plan Note (Signed)
Continue lexapro 10mg  daily.

## 2023-06-01 NOTE — Addendum Note (Signed)
 Addended by: Jeena Arnett Z on: 06/01/2023 03:20 PM   Modules accepted: Orders

## 2023-06-01 NOTE — Assessment & Plan Note (Signed)
 Poorly controlled diabetes.  Increasing lantus  to 20units and adding mounjaro  with titration over the next few months.  Encouraged low carbohydrate diet.

## 2023-06-01 NOTE — Assessment & Plan Note (Signed)
 Tolerating atorvastatin  well.  Continue current strength. Update lipids.

## 2023-06-01 NOTE — Progress Notes (Signed)
 Reported Critical A1C lab: 14.4 to PCP

## 2023-06-01 NOTE — Progress Notes (Signed)
 Frederick Powell - 48 y.o. male MRN [id number]  Date of birth: November 08, 1975  Subjective Chief Complaint  Patient presents with   Diabetes   Hypertension   Hyperlipidemia    HPI Frederick Powell is a 48 y.o. male here today for follow up visit.   He has not been seen in >1 year.  He had moved to [place] briefly and reports that he recently obtained insurance again.   Reports that he continues to take metformin  and lantus .  He is not monitoring blood sugars at this time.  A1c today is 14.4%.  He is not really paying attention to diet or activity level.    Continues on losartan  for management of HTN.  BP is mildly elevated on initial check.  He denies symptoms related to this including chest pain, shortness of breath, palpitations, headache or vision changes.   ROS:  A comprehensive ROS was completed and negative except as noted per HPI  No Known Allergies  Past Medical History:  Diagnosis Date   Diabetes mellitus without complication (HCC)    Hypertension     Past Surgical History:  Procedure Laterality Date   ROTATOR CUFF REPAIR      Social History   Socioeconomic History   Marital status: Single    Spouse name: Not on file   Number of children: Not on file   Years of education: Not on file   Highest education level: Not on file  Occupational History   Not on file  Tobacco Use   Smoking status: Every Day    Current packs/day: 0.50    Types: Cigarettes   Smokeless tobacco: Never  Vaping Use   Vaping status: Never Used  Substance and Sexual Activity   Alcohol use: Not Currently    Alcohol/week: 5.0 standard drinks of alcohol    Types: 5 Standard drinks or equivalent per week   Drug use: No   Sexual activity: Yes    Birth control/protection: None  Other Topics Concern   Not on file  Social History Narrative   Not on file   Social Drivers of Health   Financial Resource Strain: Not on file  Food Insecurity: Not on file  Transportation Needs: Not on file   Physical Activity: Not on file  Stress: Not on file  Social Connections: Not on file    Family History  Problem Relation Age of Onset   Colitis Mother    Colon cancer Neg Hx    Esophageal cancer Neg Hx    Stomach cancer Neg Hx    Rectal cancer Neg Hx     Health Maintenance  Topic Date Due   HIV Screening  Never done   OPHTHALMOLOGY EXAM  06/06/2020   HEMOGLOBIN A1C  07/29/2022   COVID-19 Vaccine (5 - 2024-25 season) 10/11/2022   Diabetic kidney evaluation - eGFR measurement  01/28/2023   Diabetic kidney evaluation - Urine ACR  01/28/2023   Pneumococcal Vaccine 45-39 Years old (1 of 2 - PCV) 05/31/2024 (Originally 06/18/1993)   INFLUENZA VACCINE  09/10/2023   FOOT EXAM  05/31/2024   Colonoscopy  07/30/2031   DTaP/Tdap/Td (2 - Td or Tdap) 01/28/2032   Hepatitis C Screening  Completed   HPV VACCINES  Aged Out   Meningococcal B Vaccine  Aged Out     ----------------------------------------------------------------------------------------------------------------------------------------------------------------------------------------------------------------- Physical Exam BP 110/74   Pulse 95   Ht 6' (1.829 m)   Wt 180 lb (81.6 kg)   SpO2 98%   BMI 24.41 kg/m  Physical Exam Constitutional:      Appearance: Normal appearance.  HENT:     Head: Normocephalic and atraumatic.  Eyes:     General: No scleral icterus. Cardiovascular:     Rate and Rhythm: Normal rate and regular rhythm.  Pulmonary:     Effort: Pulmonary effort is normal.     Breath sounds: Normal breath sounds.  Neurological:     Mental Status: He is alert.  Psychiatric:        Mood and Affect: Mood normal.        Behavior: Behavior normal.     ------------------------------------------------------------------------------------------------------------------------------------------------------------------------------------------------------------------- Assessment and Plan  Essential  hypertension BP well controlled on re-check.  Continue losartan  at current strength.   Type 2 diabetes mellitus with hyperglycemia, without long-term current use of insulin (HCC) Poorly controlled diabetes.  Increasing lantus  to 20units and adding mounjaro  with titration over the next few months.  Encouraged low carbohydrate diet.    Hyperlipidemia associated with type 2 diabetes mellitus (HCC) Tolerating atorvastatin  well.  Continue current strength. Update lipids.   Anxiety Continue lexapro  10mg  daily.     Meds ordered this encounter  Medications   Continuous Glucose Sensor (FREESTYLE LIBRE 3 SENSOR) MISC    Sig: 1 each by Does not apply route every 14 (fourteen) days. Please apply for 14 days and then switch to new sensor    Dispense:  2 each    Refill:  3    Ok to modify this rx to get coverage   atorvastatin  (LIPITOR) 10 MG tablet    Sig: TAKE 1 TABLET(10 MG) BY MOUTH DAILY    Dispense:  90 tablet    Refill:  1   escitalopram  (LEXAPRO ) 20 MG tablet    Sig: TAKE 1 TABLET(20 MG) BY MOUTH DAILY    Dispense:  90 tablet    Refill:  1   insulin glargine  (LANTUS  SOLOSTAR) 100 UNIT/ML Solostar Pen    Sig: Inject 20 Units into the skin daily. PATIENT NEEDS AN APPT FOR ADDITIONAL REFILLS.    Dispense:  18 mL    Refill:  1   metFORMIN  (GLUCOPHAGE ) 500 MG tablet    Sig: Take 1 tablet (500 mg total) by mouth 2 (two) times daily.    Dispense:  180 tablet    Refill:  1   tirzepatide  (MOUNJARO ) 2.5 MG/0.5ML Pen    Sig: Inject 2.5 mg into the skin once a week. Increase to 5mg  after first month.    Dispense:  2 mL    Refill:  0   tirzepatide  (MOUNJARO ) 5 MG/0.5ML Pen    Sig: Inject 5 mg into the skin once a week. Increase to 7.5mg  after 1 month.    Dispense:  2 mL    Refill:  0   tirzepatide  (MOUNJARO ) 7.5 MG/0.5ML Pen    Sig: Inject 7.5 mg into the skin once a week.    Dispense:  6 mL    Refill:  0    Return in about 3 months (around 08/31/2023) for Type 2 Diabetes,  Hypertension.

## 2023-06-03 LAB — CBC WITH DIFFERENTIAL/PLATELET
Basophils Absolute: 0.1 10*3/uL (ref 0.0–0.2)
Basos: 1 %
EOS (ABSOLUTE): 0.2 10*3/uL (ref 0.0–0.4)
Eos: 3 %
Hematocrit: 45.4 % (ref 37.5–51.0)
Hemoglobin: 15.3 g/dL (ref 13.0–17.7)
Immature Grans (Abs): 0 10*3/uL (ref 0.0–0.1)
Immature Granulocytes: 1 %
Lymphocytes Absolute: 2.7 10*3/uL (ref 0.7–3.1)
Lymphs: 35 %
MCH: 29.4 pg (ref 26.6–33.0)
MCHC: 33.7 g/dL (ref 31.5–35.7)
MCV: 87 fL (ref 79–97)
Monocytes Absolute: 0.6 10*3/uL (ref 0.1–0.9)
Monocytes: 8 %
Neutrophils Absolute: 4.1 10*3/uL (ref 1.4–7.0)
Neutrophils: 52 %
Platelets: 331 10*3/uL (ref 150–450)
RBC: 5.21 x10E6/uL (ref 4.14–5.80)
RDW: 12.5 % (ref 11.6–15.4)
WBC: 7.7 10*3/uL (ref 3.4–10.8)

## 2023-06-03 LAB — LIPID PANEL
Chol/HDL Ratio: 3.1 ratio (ref 0.0–5.0)
Cholesterol, Total: 172 mg/dL (ref 100–199)
HDL: 56 mg/dL (ref >39)
LDL Chol Calc (NIH): 72 mg/dL (ref 0–99)
Triglycerides: 274 mg/dL — ABNORMAL HIGH (ref 0–149)
VLDL Cholesterol Cal: 44 mg/dL — ABNORMAL HIGH (ref 5–40)

## 2023-06-03 LAB — CMP14+EGFR
ALT: 22 IU/L (ref 0–44)
AST: 14 IU/L (ref 0–40)
Albumin: 4.2 g/dL (ref 4.1–5.1)
Alkaline Phosphatase: 65 IU/L (ref 44–121)
BUN/Creatinine Ratio: 18 (ref 9–20)
BUN: 18 mg/dL (ref 6–24)
Bilirubin Total: <0.2 mg/dL (ref 0.0–1.2)
CO2: 21 mmol/L (ref 20–29)
Calcium: 9.5 mg/dL (ref 8.7–10.2)
Chloride: 96 mmol/L (ref 96–106)
Creatinine, Ser: 1.02 mg/dL (ref 0.76–1.27)
Globulin, Total: 2.9 g/dL (ref 1.5–4.5)
Glucose: 353 mg/dL — ABNORMAL HIGH (ref 70–99)
Potassium: 4.5 mmol/L (ref 3.5–5.2)
Sodium: 135 mmol/L (ref 134–144)
Total Protein: 7.1 g/dL (ref 6.0–8.5)
eGFR: 91 mL/min/{1.73_m2} (ref >59)

## 2023-06-03 LAB — MICROALBUMIN / CREATININE URINE RATIO
Creatinine, Urine: 34.8 mg/dL
Microalb/Creat Ratio: 43 mg/g{creat} — ABNORMAL HIGH (ref 0–29)
Microalbumin, Urine: 15.1 ug/mL

## 2023-06-16 ENCOUNTER — Encounter: Payer: Self-pay | Admitting: Family Medicine

## 2023-08-31 ENCOUNTER — Ambulatory Visit: Admitting: Family Medicine

## 2023-08-31 ENCOUNTER — Ambulatory Visit: Payer: Self-pay

## 2023-08-31 NOTE — Telephone Encounter (Signed)
 FYI Only or Action Required?: FYI only for provider.  Patient was last seen in primary care on 06/01/2023 by Alvia Bring, DO.  Called Nurse Triage reporting Covid Positive.  Symptoms began several days ago.  Interventions attempted: Rest, hydration, or home remedies.  Symptoms are: gradually worsening.  Triage Disposition: Home Care  Patient/caregiver understands and will follow disposition?: Yes  Copied from CRM [id number]. Topic: Clinical - Red Word Triage >> Aug 31, 2023 10:18 AM Zane F wrote: [facility] that prompted transfer to Nurse Triage:   Recent positive covid test; congestion  Fever: 100.2 Reason for Disposition  [1] COVID-19 diagnosed by positive lab test (e.g., PCR, rapid self-test kit) AND [2] mild symptoms (e.g., cough, fever, others) AND [3] no complications or SOB  Answer Assessment - Initial Assessment Questions 1. COVID-19 DIAGNOSIS: How do you know that you have COVID? (e.g., positive lab test or self-test, diagnosed by doctor or NP/PA, symptoms after exposure).     Home test   2. ONSET: When did the COVID-19 symptoms start?      Few days 3. WORST SYMPTOM: What is your worst symptom? (e.g., cough, fever, shortness of breath, muscle aches)     Congestion 5. COUGH: Do you have a cough? If Yes, ask: How bad is the cough?       Denies 6. FEVER: Do you have a fever? If Yes, ask: What is your temperature, how was it measured, and when did it start?     100.2 7. RESPIRATORY STATUS: Describe your breathing? (e.g., normal; shortness of breath, wheezing, unable to speak)      normal 8. BETTER-SAME-WORSE: Are you getting better, staying the same or getting worse compared to yesterday?  If getting worse, ask, In what way?     Same 9. OTHER SYMPTOMS: Do you have any other symptoms?  (e.g., chills, fatigue, headache, loss of smell or taste, muscle pain, sore throat)     congestion   Additional info: Has follow up appointment today, wondering  if he should reschedule.Spoke with CAL, follow up appointment rescheduled to 09/07/23  Protocols used: Coronavirus (COVID-19) Diagnosed or Suspected-A-AH

## 2023-09-07 ENCOUNTER — Ambulatory Visit: Admitting: Family Medicine

## 2024-02-12 ENCOUNTER — Emergency Department (HOSPITAL_COMMUNITY)

## 2024-02-12 ENCOUNTER — Emergency Department (HOSPITAL_COMMUNITY)
Admission: EM | Admit: 2024-02-12 | Discharge: 2024-02-13 | Disposition: A | Attending: Emergency Medicine | Admitting: Emergency Medicine

## 2024-02-12 DIAGNOSIS — I10 Essential (primary) hypertension: Secondary | ICD-10-CM | POA: Insufficient documentation

## 2024-02-12 DIAGNOSIS — Z79899 Other long term (current) drug therapy: Secondary | ICD-10-CM | POA: Diagnosis not present

## 2024-02-12 DIAGNOSIS — R000 Tachycardia, unspecified: Secondary | ICD-10-CM | POA: Diagnosis not present

## 2024-02-12 DIAGNOSIS — Z7984 Long term (current) use of oral hypoglycemic drugs: Secondary | ICD-10-CM | POA: Insufficient documentation

## 2024-02-12 DIAGNOSIS — W108XXA Fall (on) (from) other stairs and steps, initial encounter: Secondary | ICD-10-CM | POA: Insufficient documentation

## 2024-02-12 DIAGNOSIS — E119 Type 2 diabetes mellitus without complications: Secondary | ICD-10-CM | POA: Diagnosis not present

## 2024-02-12 DIAGNOSIS — Y92007 Garden or yard of unspecified non-institutional (private) residence as the place of occurrence of the external cause: Secondary | ICD-10-CM | POA: Diagnosis not present

## 2024-02-12 DIAGNOSIS — Z794 Long term (current) use of insulin: Secondary | ICD-10-CM | POA: Insufficient documentation

## 2024-02-12 DIAGNOSIS — F17200 Nicotine dependence, unspecified, uncomplicated: Secondary | ICD-10-CM | POA: Diagnosis not present

## 2024-02-12 DIAGNOSIS — S299XXA Unspecified injury of thorax, initial encounter: Secondary | ICD-10-CM | POA: Diagnosis present

## 2024-02-12 MED ORDER — OXYCODONE-ACETAMINOPHEN 5-325 MG PO TABS
1.0000 | ORAL_TABLET | Freq: Once | ORAL | Status: AC
  Administered 2024-02-12: 1 via ORAL
  Filled 2024-02-12: qty 1

## 2024-02-12 MED ORDER — KETOROLAC TROMETHAMINE 30 MG/ML IJ SOLN
30.0000 mg | Freq: Once | INTRAMUSCULAR | Status: AC
  Administered 2024-02-12: 30 mg via INTRAMUSCULAR
  Filled 2024-02-12: qty 1

## 2024-02-12 NOTE — ED Provider Notes (Signed)
 " [place] AT [facility] Provider Note   CSN: [id number] Arrival date & time: 02/12/24  1817     Patient presents with: Frederick Powell is a 49 y.o. male.  {Add pertinent medical, surgical, social history, OB history to HPI:32947} HPI     This a 49 year old male who presents with a injury to the left side of his chest.  Patient reports he missed a step going down off of his porch and hit his left side on the railing.  He did not fall, hit his head or lose consciousness.  He is complaining of pain on the left chest wall.  Reports pain with inspiration and movement.  He is currently a smoker.  No recent cough.  Patient is not on any blood thinners.  Prior to Admission medications  Medication Sig Start Date End Date Taking? Authorizing Provider  acetaminophen  (TYLENOL ) 325 MG tablet Take 650 mg by mouth every 4 (four) hours as needed.    [provider]  atorvastatin  (LIPITOR) 10 MG tablet TAKE 1 TABLET(10 MG) BY MOUTH DAILY 06/01/23   Alvia Bring, DO  BD PEN NEEDLE NANO 2ND GEN 32G X 4 MM MISC USE AS DIRECTED TO INJECT INSULIN 08/17/19   Alvia Bring, DO  Blood Glucose Monitoring Suppl (ONETOUCH VERIO) w/Device KIT Use to check glucose daily.  Please dispense appropriate lancets and strips. 02/24/19   Alvia Bring, DO  Continuous Glucose Sensor (FREESTYLE LIBRE 3 SENSOR) MISC 1 each by Does not apply route every 14 (fourteen) days. Please apply for 14 days and then switch to new sensor 06/01/23   Alvia Bring, DO  escitalopram  (LEXAPRO ) 20 MG tablet TAKE 1 TABLET(20 MG) BY MOUTH DAILY 06/01/23   Alvia Bring, DO  glucose blood (ONETOUCH VERIO) test strip USE AS DIRECTED TO TEST BLOOD SUGAR DAILY 01/07/21   Alvia Bring, DO  insulin glargine  (LANTUS  SOLOSTAR) 100 UNIT/ML Solostar Pen Inject 20 Units into the skin daily. PATIENT NEEDS AN APPT FOR ADDITIONAL REFILLS. 06/01/23 08/30/23  Alvia Bring, DO  Lancets [facility] DELICA PLUS  LANCET30G) MISC USE AS DIRECTED TO TEST GLUCOSE DAILY 04/29/21   Alvia Bring, DO  loratadine (CLARITIN) 10 MG tablet Take 10 mg by mouth daily.    [provider]  losartan  (COZAAR ) 100 MG tablet Take 1 tablet (100 mg total) by mouth daily. 01/27/22   Alvia Bring, DO  metFORMIN  (GLUCOPHAGE ) 500 MG tablet Take 1 tablet (500 mg total) by mouth 2 (two) times daily. 06/01/23   Alvia Bring, DO  NOVOFINE AUTOCOVER PEN NEEDLE 30G X 8 MM MISC INJECY 10 EACH INTO SKIN AS NEEDED 11/30/19   Alvia Bring, DO  tadalafil  (CIALIS ) 20 MG tablet Take 0.5-1 tablets (10-20 mg total) by mouth every other day as needed for erectile dysfunction. 01/07/21   Alvia Bring, DO    Allergies: Patient has no known allergies.    Review of Systems  Constitutional:  Negative for fever.  Respiratory:  Negative for cough and shortness of breath.   Cardiovascular:  Positive for chest pain.  All other systems reviewed and are negative.   Updated Vital Signs BP (!) 155/98   Pulse 92   Temp 98.2 F (36.8 C)   Resp 17   Ht 1.778 m (5' 10)   Wt 86.2 kg   SpO2 98%   BMI 27.26 kg/m   Physical Exam Vitals and nursing note reviewed.  Constitutional:      Appearance: He is well-developed. He  is not ill-appearing.  HENT:     Head: Normocephalic and atraumatic.  Eyes:     Pupils: Pupils are equal, round, and reactive to light.  Cardiovascular:     Rate and Rhythm: Regular rhythm. Tachycardia present.     Heart sounds: Normal heart sounds. No murmur heard. Pulmonary:     Effort: Pulmonary effort is normal. No respiratory distress.     Breath sounds: Normal breath sounds. No wheezing.     Comments: Left chest wall tenderness to palpation, no overlying bruising or crepitus noted Chest:     Chest wall: Tenderness present.  Abdominal:     Palpations: Abdomen is soft.     Tenderness: There is no abdominal tenderness.  Musculoskeletal:     Cervical back: Neck supple.  Lymphadenopathy:      Cervical: No cervical adenopathy.  Skin:    General: Skin is warm and dry.  Neurological:     Mental Status: He is alert and oriented to person, place, and time.  Psychiatric:        Mood and Affect: Mood normal.     (all labs ordered are listed, but only abnormal results are displayed) Labs Reviewed - No data to display  EKG: None  Radiology: DG Ribs Unilateral W/Chest Left Result Date: 02/12/2024 EXAM: 1 VIEW(S) XRAY OF THE LEFT RIBS AND CHEST 02/12/2024 07:03:30 PM COMPARISON: Comparison with 07/21/2010. CLINICAL HISTORY: fall/pain/shobr FINDINGS: BONES: Left ribs appear intact. No acute displaced fractures are identified. No focal bone lesion or bone destruction. LUNGS AND PLEURA: Lungs are clear. No pleural effusion or pneumothorax. HEART AND MEDIASTINUM: Heart size and pulmonary vascularity are normal. Mediastinal contours appear intact. Soft tissues are unremarkable. IMPRESSION: 1. No acute left rib fracture. 2. No pneumothorax or acute cardiopulmonary abnormality. Electronically signed by: Elsie Gravely MD 02/12/2024 07:25 PM EST RP Workstation: [id number]    {Document cardiac monitor, telemetry assessment procedure when appropriate:32947} Procedures   Medications Ordered in the ED  ketorolac  (TORADOL ) 30 MG/ML injection 30 mg (30 mg Intramuscular Given 02/12/24 2352)  oxyCODONE -acetaminophen  (PERCOCET/ROXICET) 5-325 MG per tablet 1 tablet (1 tablet Oral Given 02/12/24 2352)      {Click here for ABCD2, HEART and other calculators REFRESH Note before signing:1}                              Medical Decision Making Amount and/or Complexity of Data Reviewed Radiology: ordered.  Risk Prescription drug management.   ***  {Document critical care time when appropriate  Document review of labs and clinical decision tools ie CHADS2VASC2, etc  Document your independent review of radiology images and any outside records  Document your discussion with family members, caretakers  and with consultants  Document social determinants of health affecting pt's care  Document your decision making why or why not admission, treatments were needed:32947:::1}   Final diagnoses:  None    ED Discharge Orders     None        "

## 2024-02-12 NOTE — ED Triage Notes (Signed)
 Patient reports falling off porch today and landing on yard rail  Patient reports pain in left ribs rated 10/10 Says it is hard to breathe and cough

## 2024-02-13 MED ORDER — OXYCODONE-ACETAMINOPHEN 5-325 MG PO TABS: 1.0000 | ORAL_TABLET | Freq: Four times a day (QID) | ORAL | 0 refills | Status: AC | PRN

## 2024-02-13 MED ORDER — IBUPROFEN 600 MG PO TABS: 600.0000 mg | ORAL_TABLET | Freq: Four times a day (QID) | ORAL | 0 refills | Status: AC | PRN

## 2024-02-13 NOTE — Discharge Instructions (Addendum)
 You were seen today for chest wall injury.  You may have a rib fracture that cannot be seen on x-ray.  Use the incentive spirometer to make sure that you are taking big deep breaths.  Take scheduled ibuprofen  for the next 3 to 5 days.  If you have breakthrough pain you may take Percocet.  You should not drive while taking Percocet.  Apply ice.  You should avoid smoking as this will increase your risk for complications.
